# Patient Record
Sex: Female | Born: 1995 | Race: White | Hispanic: No | Marital: Single | Smoking: Former smoker
Health system: Southern US, Community
[De-identification: ages and names within clinical notes are randomized; demographics above are authoritative.]

## PROBLEM LIST (undated history)

## (undated) ENCOUNTER — Inpatient Hospital Stay (HOSPITAL_COMMUNITY): Payer: Self-pay

## (undated) DIAGNOSIS — Z789 Other specified health status: Secondary | ICD-10-CM

## (undated) HISTORY — PX: MOUTH SURGERY: SHX715

## (undated) HISTORY — PX: APPENDECTOMY: SHX54

## (undated) HISTORY — PX: WRIST SURGERY: SHX841

---

## 2016-03-15 ENCOUNTER — Other Ambulatory Visit: Payer: Self-pay | Admitting: Obstetrics and Gynecology

## 2016-03-15 ENCOUNTER — Ambulatory Visit (HOSPITAL_COMMUNITY)
Admission: RE | Admit: 2016-03-15 | Discharge: 2016-03-15 | Disposition: A | Discharge status: Home / Self Care | Payer: PRIVATE HEALTH INSURANCE | Source: Ambulatory Visit | Attending: Obstetrics and Gynecology | Admitting: Obstetrics and Gynecology

## 2016-03-15 DIAGNOSIS — O209 Hemorrhage in early pregnancy, unspecified: Secondary | ICD-10-CM

## 2016-03-15 DIAGNOSIS — Z3A01 Less than 8 weeks gestation of pregnancy: Secondary | ICD-10-CM | POA: Diagnosis not present

## 2016-03-15 DIAGNOSIS — N926 Irregular menstruation, unspecified: Secondary | ICD-10-CM

## 2016-03-15 DIAGNOSIS — Z36 Encounter for antenatal screening of mother: Secondary | ICD-10-CM | POA: Diagnosis not present

## 2016-03-15 DIAGNOSIS — Z3491 Encounter for supervision of normal pregnancy, unspecified, first trimester: Secondary | ICD-10-CM

## 2016-03-15 DIAGNOSIS — Z3201 Encounter for pregnancy test, result positive: Secondary | ICD-10-CM

## 2016-03-21 ENCOUNTER — Other Ambulatory Visit: Payer: Self-pay | Admitting: Obstetrics and Gynecology

## 2016-03-21 DIAGNOSIS — Z3201 Encounter for pregnancy test, result positive: Secondary | ICD-10-CM

## 2016-03-21 DIAGNOSIS — N926 Irregular menstruation, unspecified: Secondary | ICD-10-CM

## 2016-03-29 ENCOUNTER — Ambulatory Visit (HOSPITAL_COMMUNITY)
Admission: RE | Admit: 2016-03-29 | Discharge: 2016-03-29 | Disposition: A | Discharge status: Home / Self Care | Payer: PRIVATE HEALTH INSURANCE | Source: Ambulatory Visit | Attending: Obstetrics and Gynecology | Admitting: Obstetrics and Gynecology

## 2016-03-29 DIAGNOSIS — Z36 Encounter for antenatal screening of mother: Secondary | ICD-10-CM | POA: Diagnosis not present

## 2016-03-29 DIAGNOSIS — Z3A01 Less than 8 weeks gestation of pregnancy: Secondary | ICD-10-CM | POA: Diagnosis not present

## 2016-03-29 DIAGNOSIS — Z3201 Encounter for pregnancy test, result positive: Secondary | ICD-10-CM

## 2016-03-29 DIAGNOSIS — N926 Irregular menstruation, unspecified: Secondary | ICD-10-CM

## 2016-04-10 ENCOUNTER — Other Ambulatory Visit: Payer: Self-pay | Admitting: Obstetrics and Gynecology

## 2016-04-10 DIAGNOSIS — Z8742 Personal history of other diseases of the female genital tract: Secondary | ICD-10-CM

## 2016-04-12 ENCOUNTER — Ambulatory Visit (HOSPITAL_COMMUNITY)
Admission: RE | Admit: 2016-04-12 | Discharge: 2016-04-12 | Disposition: A | Discharge status: Home / Self Care | Payer: PRIVATE HEALTH INSURANCE | Source: Ambulatory Visit | Attending: Obstetrics and Gynecology | Admitting: Obstetrics and Gynecology

## 2016-04-12 DIAGNOSIS — O209 Hemorrhage in early pregnancy, unspecified: Secondary | ICD-10-CM | POA: Diagnosis not present

## 2016-04-12 DIAGNOSIS — Z3A09 9 weeks gestation of pregnancy: Secondary | ICD-10-CM | POA: Diagnosis not present

## 2016-04-12 DIAGNOSIS — Z8742 Personal history of other diseases of the female genital tract: Secondary | ICD-10-CM

## 2016-05-03 ENCOUNTER — Encounter: Payer: Self-pay | Admitting: Obstetrics & Gynecology

## 2016-05-03 ENCOUNTER — Ambulatory Visit (INDEPENDENT_AMBULATORY_CARE_PROVIDER_SITE_OTHER): Payer: PRIVATE HEALTH INSURANCE | Admitting: Obstetrics & Gynecology

## 2016-05-03 ENCOUNTER — Other Ambulatory Visit: Payer: Self-pay | Admitting: Obstetrics & Gynecology

## 2016-05-03 VITALS — BP 105/65 | HR 85 | Ht 66.0 in | Wt 131.0 lb

## 2016-05-03 DIAGNOSIS — Z36 Encounter for antenatal screening of mother: Secondary | ICD-10-CM

## 2016-05-03 DIAGNOSIS — O99330 Smoking (tobacco) complicating pregnancy, unspecified trimester: Secondary | ICD-10-CM | POA: Insufficient documentation

## 2016-05-03 DIAGNOSIS — Z3490 Encounter for supervision of normal pregnancy, unspecified, unspecified trimester: Secondary | ICD-10-CM | POA: Insufficient documentation

## 2016-05-03 DIAGNOSIS — O98311 Other infections with a predominantly sexual mode of transmission complicating pregnancy, first trimester: Secondary | ICD-10-CM

## 2016-05-03 DIAGNOSIS — A749 Chlamydial infection, unspecified: Secondary | ICD-10-CM

## 2016-05-03 DIAGNOSIS — O99331 Smoking (tobacco) complicating pregnancy, first trimester: Secondary | ICD-10-CM

## 2016-05-03 DIAGNOSIS — Z3481 Encounter for supervision of other normal pregnancy, first trimester: Secondary | ICD-10-CM

## 2016-05-03 DIAGNOSIS — Z113 Encounter for screening for infections with a predominantly sexual mode of transmission: Secondary | ICD-10-CM

## 2016-05-03 DIAGNOSIS — O98819 Other maternal infectious and parasitic diseases complicating pregnancy, unspecified trimester: Secondary | ICD-10-CM

## 2016-05-03 DIAGNOSIS — O98811 Other maternal infectious and parasitic diseases complicating pregnancy, first trimester: Secondary | ICD-10-CM

## 2016-05-03 MED ORDER — PROMETHAZINE HCL 25 MG PO TABS: 25.0000 mg | ORAL_TABLET | Freq: Four times a day (QID) | ORAL | Status: DC | PRN

## 2016-05-03 NOTE — Progress Notes (Signed)
Subjective:    Vicki Hooper is being seen today for her first obstetrical visit.  This is not a planned pregnancy. She is at 6243w1d gestation. Her obstetrical history is significant for smoker- stopped after learning of pregnancy. Relationship with FOB: significant other, not living together. Patient does intend to breast feed. Pregnancy history fully reviewed.  Pt reports that she was told that she had a cervical infection at her visit with Vicki Hooper.  She was treated.  Menstrual History: OB History    Gravida Para Term Preterm AB TAB SAB Ectopic Multiple Living   1                No LMP recorded. Patient is pregnant.    The following portions of the patient's history were reviewed and updated as appropriate: allergies, current medications, past family history, past medical history, past social history, past surgical history and problem list.  Review of Systems Pertinent items are noted in HPI.    Objective:  BP 105/65 mmHg  Pulse 85  Ht 5\' 6"  (1.676 m)  Wt 131 lb (59.421 kg)  BMI 21.15 kg/m2 General Appearance:    Alert, cooperative, no distress, appears stated age  Head:    Normocephalic, without obvious abnormality, atraumatic  Eyes:    conjunctiva/corneas clear, EOM's intact, both eyes  Ears:    Normal external ear canals, both ears  Nose:   Nares normal, septum midline, mucosa normal, no drainage    or sinus tenderness  Throat:   Lips, mucosa, and tongue normal; teeth and gums normal  Neck:   Supple, symmetrical, trachea midline, no adenopathy;    thyroid:  no enlargement/tenderness/nodules  Back:     Symmetric, no curvature, ROM normal, no CVA tenderness  Lungs:     Clear to auscultation bilaterally, respirations unlabored  Chest Wall:    No tenderness or deformity   Heart:    Regular rate and rhythm, S1 and S2 normal, no murmur, rub   or gallop  Breast Exam:    No tenderness or nipple abnormality. Pt has lots of lumps in breasts- stable per pt  Abdomen:      Soft, non-tender, bowel sounds active all four quadrants,    no masses, no organomegaly  Genitalia:  Deferred (per pt PAP and cx done by Vicki Hooper)     Extremities:   Extremities normal, atraumatic, no cyanosis or edema  Pulses:   2+ and symmetric all extremities  Skin:   Skin color, texture, turgor normal, no rashes or lesions      Assessment:    Pregnancy at 12 and 1/7 weeks   Fibrocystic breasts    Plan:    Initial labs drawn. Prenatal vitamins. Problem list reviewed and updated. AFP3 discussed: requested. Role of ultrasound in pregnancy discussed; fetal survey: requested. Amniocentesis discussed: not indicated. Follow up in 4 weeks. 70% of 40 min visit spent on counseling and coordination of care.   Phenergan 25 mg po q 6 hours prn.  Initial rx by Dr. Chilton Hooper Need release of records from Vicki Hooper to get Pap and cx  Vicki Hooper, M.D., Evern CoreFACOG

## 2016-05-03 NOTE — Patient Instructions (Signed)
First Trimester of Pregnancy The first trimester of pregnancy is from week 1 until the end of week 12 (months 1 through 3). A week after a sperm fertilizes an egg, the egg will implant on the wall of the uterus. This embryo will begin to develop into a baby. Genes from you and your partner are forming the baby. The female genes determine whether the baby is a boy or a girl. At 6-8 weeks, the eyes and face are formed, and the heartbeat can be seen on ultrasound. At the end of 12 weeks, all the baby's organs are formed.  Now that you are pregnant, you will want to do everything you can to have a healthy baby. Two of the most important things are to get good prenatal care and to follow your health care provider's instructions. Prenatal care is all the medical care you receive before the baby's birth. This care will help prevent, find, and treat any problems during the pregnancy and childbirth. BODY CHANGES Your body goes through many changes during pregnancy. The changes vary from woman to woman.   You may gain or lose a couple of pounds at first.  You may feel sick to your stomach (nauseous) and throw up (vomit). If the vomiting is uncontrollable, call your health care provider.  You may tire easily.  You may develop headaches that can be relieved by medicines approved by your health care provider.  You may urinate more often. Painful urination may mean you have a bladder infection.  You may develop heartburn as a result of your pregnancy.  You may develop constipation because certain hormones are causing the muscles that push waste through your intestines to slow down.  You may develop hemorrhoids or swollen, bulging veins (varicose veins).  Your breasts may begin to grow larger and become tender. Your nipples may stick out more, and the tissue that surrounds them (areola) may become darker.  Your gums may bleed and may be sensitive to brushing and flossing.  Dark spots or blotches (chloasma,  mask of pregnancy) may develop on your face. This will likely fade after the baby is born.  Your menstrual periods will stop.  You may have a loss of appetite.  You may develop cravings for certain kinds of food.  You may have changes in your emotions from day to day, such as being excited to be pregnant or being concerned that something may go wrong with the pregnancy and baby.  You may have more vivid and strange dreams.  You may have changes in your hair. These can include thickening of your hair, rapid growth, and changes in texture. Some women also have hair loss during or after pregnancy, or hair that feels dry or thin. Your hair will most likely return to normal after your baby is born. WHAT TO EXPECT AT YOUR PRENATAL VISITS During a routine prenatal visit:  You will be weighed to make sure you and the baby are growing normally.  Your blood pressure will be taken.  Your abdomen will be measured to track your baby's growth.  The fetal heartbeat will be listened to starting around week 10 or 12 of your pregnancy.  Test results from any previous visits will be discussed. Your health care provider may ask you:  How you are feeling.  If you are feeling the baby move.  If you have had any abnormal symptoms, such as leaking fluid, bleeding, severe headaches, or abdominal cramping.  If you are using any tobacco products,   including cigarettes, chewing tobacco, and electronic cigarettes.  If you have any questions. Other tests that may be performed during your first trimester include:  Blood tests to find your blood type and to check for the presence of any previous infections. They will also be used to check for low iron levels (anemia) and Rh antibodies. Later in the pregnancy, blood tests for diabetes will be done along with other tests if problems develop.  Urine tests to check for infections, diabetes, or protein in the urine.  An ultrasound to confirm the proper growth  and development of the baby.  An amniocentesis to check for possible genetic problems.  Fetal screens for spina bifida and Down syndrome.  You may need other tests to make sure you and the baby are doing well.  HIV (human immunodeficiency virus) testing. Routine prenatal testing includes screening for HIV, unless you choose not to have this test. HOME CARE INSTRUCTIONS  Medicines  Follow your health care provider's instructions regarding medicine use. Specific medicines may be either safe or unsafe to take during pregnancy.  Take your prenatal vitamins as directed.  If you develop constipation, try taking a stool softener if your health care provider approves. Diet  Eat regular, well-balanced meals. Choose a variety of foods, such as meat or vegetable-based protein, fish, milk and low-fat dairy products, vegetables, fruits, and whole grain breads and cereals. Your health care provider will help you determine the amount of weight gain that is right for you.  Avoid raw meat and uncooked cheese. These carry germs that can cause birth defects in the baby.  Eating four or five small meals rather than three large meals a day may help relieve nausea and vomiting. If you start to feel nauseous, eating a few soda crackers can be helpful. Drinking liquids between meals instead of during meals also seems to help nausea and vomiting.  If you develop constipation, eat more high-fiber foods, such as fresh vegetables or fruit and whole grains. Drink enough fluids to keep your urine clear or pale yellow. Activity and Exercise  Exercise only as directed by your health care provider. Exercising will help you:  Control your weight.  Stay in shape.  Be prepared for labor and delivery.  Experiencing pain or cramping in the lower abdomen or low back is a good sign that you should stop exercising. Check with your health care provider before continuing normal exercises.  Try to avoid standing for long  periods of time. Move your legs often if you must stand in one place for a long time.  Avoid heavy lifting.  Wear low-heeled shoes, and practice good posture.  You may continue to have sex unless your health care provider directs you otherwise. Relief of Pain or Discomfort  Wear a good support bra for breast tenderness.   Take warm sitz baths to soothe any pain or discomfort caused by hemorrhoids. Use hemorrhoid cream if your health care provider approves.   Rest with your legs elevated if you have leg cramps or low back pain.  If you develop varicose veins in your legs, wear support hose. Elevate your feet for 15 minutes, 3-4 times a day. Limit salt in your diet. Prenatal Care  Schedule your prenatal visits by the twelfth week of pregnancy. They are usually scheduled monthly at first, then more often in the last 2 months before delivery.  Write down your questions. Take them to your prenatal visits.  Keep all your prenatal visits as directed by your   health care provider. Safety  Wear your seat belt at all times when driving.  Make a list of emergency phone numbers, including numbers for family, friends, the hospital, and police and fire departments. General Tips  Ask your health care provider for a referral to a local prenatal education class. Begin classes no later than at the beginning of month 6 of your pregnancy.  Ask for help if you have counseling or nutritional needs during pregnancy. Your health care provider can offer advice or refer you to specialists for help with various needs.  Do not use hot tubs, steam rooms, or saunas.  Do not douche or use tampons or scented sanitary pads.  Do not cross your legs for long periods of time.  Avoid cat litter boxes and soil used by cats. These carry germs that can cause birth defects in the baby and possibly loss of the fetus by miscarriage or stillbirth.  Avoid all smoking, herbs, alcohol, and medicines not prescribed by  your health care provider. Chemicals in these affect the formation and growth of the baby.  Do not use any tobacco products, including cigarettes, chewing tobacco, and electronic cigarettes. If you need help quitting, ask your health care provider. You may receive counseling support and other resources to help you quit.  Schedule a dentist appointment. At home, brush your teeth with a soft toothbrush and be gentle when you floss. SEEK MEDICAL CARE IF:   You have dizziness.  You have mild pelvic cramps, pelvic pressure, or nagging pain in the abdominal area.  You have persistent nausea, vomiting, or diarrhea.  You have a bad smelling vaginal discharge.  You have pain with urination.  You notice increased swelling in your face, hands, legs, or ankles. SEEK IMMEDIATE MEDICAL CARE IF:   You have a fever.  You are leaking fluid from your vagina.  You have spotting or bleeding from your vagina.  You have severe abdominal cramping or pain.  You have rapid weight gain or loss.  You vomit blood or material that looks like coffee grounds.  You are exposed to German measles and have never had them.  You are exposed to fifth disease or chickenpox.  You develop a severe headache.  You have shortness of breath.  You have any kind of trauma, such as from a fall or a car accident.   This information is not intended to replace advice given to you by your health care provider. Make sure you discuss any questions you have with your health care provider.   Document Released: 11/13/2001 Document Revised: 12/10/2014 Document Reviewed: 09/29/2013 Elsevier Interactive Patient Education 2016 Elsevier Inc.  

## 2016-05-04 LAB — OBSTETRIC PANEL
Antibody Screen: NEGATIVE
BASOS PCT: 0 %
Basophils Absolute: 0 cells/uL (ref 0–200)
EOS ABS: 156 {cells}/uL (ref 15–500)
EOS PCT: 2 %
HEMATOCRIT: 38.4 % (ref 35.0–45.0)
HEMOGLOBIN: 12.7 g/dL (ref 11.7–15.5)
Hepatitis B Surface Ag: NEGATIVE
LYMPHS ABS: 1950 {cells}/uL (ref 850–3900)
Lymphocytes Relative: 25 %
MCH: 30.2 pg (ref 27.0–33.0)
MCHC: 33.1 g/dL (ref 32.0–36.0)
MCV: 91.2 fL (ref 80.0–100.0)
MONOS PCT: 7 %
MPV: 9.9 fL (ref 7.5–12.5)
Monocytes Absolute: 546 cells/uL (ref 200–950)
NEUTROS ABS: 5148 {cells}/uL (ref 1500–7800)
Neutrophils Relative %: 66 %
Platelets: 210 10*3/uL (ref 140–400)
RBC: 4.21 MIL/uL (ref 3.80–5.10)
RDW: 12.8 % (ref 11.0–15.0)
Rh Type: POSITIVE
Rubella: 3.72 Index — ABNORMAL HIGH (ref <0.90)
WBC: 7.8 10*3/uL (ref 3.8–10.8)

## 2016-05-04 LAB — GC/CHLAMYDIA PROBE AMP
CT Probe RNA: DETECTED — AB
GC Probe RNA: NOT DETECTED

## 2016-05-04 LAB — HIV ANTIBODY (ROUTINE TESTING W REFLEX): HIV 1&2 Ab, 4th Generation: NONREACTIVE

## 2016-05-06 LAB — CULTURE, URINE COMPREHENSIVE

## 2016-05-07 ENCOUNTER — Other Ambulatory Visit: Payer: Self-pay | Admitting: Obstetrics & Gynecology

## 2016-05-07 ENCOUNTER — Telehealth: Payer: Self-pay

## 2016-05-07 DIAGNOSIS — A749 Chlamydial infection, unspecified: Secondary | ICD-10-CM

## 2016-05-07 DIAGNOSIS — O98819 Other maternal infectious and parasitic diseases complicating pregnancy, unspecified trimester: Principal | ICD-10-CM

## 2016-05-07 DIAGNOSIS — O98811 Other maternal infectious and parasitic diseases complicating pregnancy, first trimester: Secondary | ICD-10-CM

## 2016-05-07 MED ORDER — AZITHROMYCIN 500 MG PO TABS: 1000.0000 mg | ORAL_TABLET | Freq: Once | ORAL | Status: DC

## 2016-05-07 NOTE — Telephone Encounter (Signed)
Please call pt. She had chlamydia. She AND her partner need to be treated. Please ask her not to be sexually active until her TOC is negative.   Thx  clh-S      Attempted to reach patient and unable to leave message. Armandina StammerJennifer Kenyata Napier RN BSN

## 2016-05-08 NOTE — Telephone Encounter (Signed)
Phone called again and now states that phone number has been changed or disconnected. Armandina StammerJennifer Corky Blumstein RN BSN

## 2016-05-20 ENCOUNTER — Encounter (HOSPITAL_COMMUNITY): Payer: Self-pay

## 2016-05-20 ENCOUNTER — Inpatient Hospital Stay (HOSPITAL_COMMUNITY)
Admission: AD | Admit: 2016-05-20 | Discharge: 2016-05-20 | Disposition: A | Discharge status: Home / Self Care | Payer: PRIVATE HEALTH INSURANCE | Source: Ambulatory Visit | Attending: Obstetrics and Gynecology | Admitting: Obstetrics and Gynecology

## 2016-05-20 ENCOUNTER — Inpatient Hospital Stay (HOSPITAL_COMMUNITY): Payer: PRIVATE HEALTH INSURANCE

## 2016-05-20 DIAGNOSIS — O468X2 Other antepartum hemorrhage, second trimester: Secondary | ICD-10-CM | POA: Diagnosis not present

## 2016-05-20 DIAGNOSIS — O99331 Smoking (tobacco) complicating pregnancy, first trimester: Secondary | ICD-10-CM

## 2016-05-20 DIAGNOSIS — O98811 Other maternal infectious and parasitic diseases complicating pregnancy, first trimester: Secondary | ICD-10-CM

## 2016-05-20 DIAGNOSIS — Z3481 Encounter for supervision of other normal pregnancy, first trimester: Secondary | ICD-10-CM

## 2016-05-20 DIAGNOSIS — O209 Hemorrhage in early pregnancy, unspecified: Secondary | ICD-10-CM | POA: Insufficient documentation

## 2016-05-20 DIAGNOSIS — O99332 Smoking (tobacco) complicating pregnancy, second trimester: Secondary | ICD-10-CM | POA: Insufficient documentation

## 2016-05-20 DIAGNOSIS — O4692 Antepartum hemorrhage, unspecified, second trimester: Secondary | ICD-10-CM

## 2016-05-20 DIAGNOSIS — A749 Chlamydial infection, unspecified: Secondary | ICD-10-CM

## 2016-05-20 DIAGNOSIS — F17210 Nicotine dependence, cigarettes, uncomplicated: Secondary | ICD-10-CM | POA: Insufficient documentation

## 2016-05-20 DIAGNOSIS — Z3A14 14 weeks gestation of pregnancy: Secondary | ICD-10-CM | POA: Insufficient documentation

## 2016-05-20 DIAGNOSIS — O418X20 Other specified disorders of amniotic fluid and membranes, second trimester, not applicable or unspecified: Secondary | ICD-10-CM

## 2016-05-20 LAB — URINALYSIS, ROUTINE W REFLEX MICROSCOPIC
Bilirubin Urine: NEGATIVE
Glucose, UA: NEGATIVE mg/dL
Ketones, ur: NEGATIVE mg/dL
LEUKOCYTES UA: NEGATIVE
Nitrite: NEGATIVE
PROTEIN: NEGATIVE mg/dL
Specific Gravity, Urine: 1.01 (ref 1.005–1.030)
pH: 7 (ref 5.0–8.0)

## 2016-05-20 LAB — URINE MICROSCOPIC-ADD ON: WBC UA: NONE SEEN WBC/hpf (ref 0–5)

## 2016-05-20 NOTE — MAU Note (Signed)
Pt states she began having bleeding & cramping yesterday, continues today, states bleeding is heavy & she is passing clots.

## 2016-05-20 NOTE — MAU Provider Note (Signed)
History     CSN: [id number]  Arrival date and time: 05/20/16 1457   None     Chief Complaint  Patient presents with  . Vaginal Bleeding  . Abdominal Pain   HPI  Pt is 4022w4d pregnant G1P0 who is a patient of Dr[facility] Erin [facility]raway [facility]. Pt has had bleeding in pregnancy and [facility]  Pt last had [facility] 1 month ago [facility] pt's bleeding stopped then she had increase in bleeding and cramping that began yesterday. Pt has not taken anything for the cramping.   Pt states her bleeding has lessened since she has been in MAU. Rn note:  Expand All Collapse All   Pt states she began having bleeding & cramping yesterday, continues today, states bleeding is heavy & she is passing clots.         History reviewed. No pertinent past medical history.  Past Surgical History  Procedure Laterality Date  . Mouth surgery    . Wrist surgery Left     Family History  Problem Relation Age of Onset  . Diabetes Maternal Grandfather   . Diabetes Paternal Grandfather   . Diabetes Sister   . Hypertension Maternal Grandfather   . Hypertension Mother     Social History  Substance Use Topics  . Smoking status: Current Some Day Smoker -- 0.25 packs/day for 3 years    Types: Cigarettes    Last Attempt to Quit: 02/01/2016  . Smokeless tobacco: Never Used  . Alcohol Use: No    Allergies: No Known Allergies  Prescriptions prior to admission  Medication Sig Dispense Refill Last Dose  . acetaminophen (TYLENOL) 500 MG tablet Take 1,000 mg by mouth every 6 (six) hours as needed for mild pain or headache.   05/19/2016 at 1800  . Prenatal Vit-Fe Fumarate-FA (PRENATAL MULTIVITAMIN) TABS tablet Take 1 tablet by mouth every evening.   05/19/2016 at Unknown time  . promethazine (PHENERGAN) 25 MG tablet Take 1 tablet (25 mg total) by mouth every 6 (six) hours as needed for nausea or vomiting. 30 tablet 2 Past Week at Unknown time  . azithromycin (ZITHROMAX) 500 MG tablet Take 2 tablets  (1,000 mg total) by mouth once. (Patient not taking: Reported on 05/20/2016) 2 tablet 1     Review of Systems  Constitutional: Negative for fever and chills.  Gastrointestinal: Positive for abdominal pain. Negative for nausea, vomiting, diarrhea and constipation.  Genitourinary: Negative for dysuria.   Physical Exam   Blood pressure 110/61, pulse 79, temperature 98.3 F (36.8 C), temperature source Oral, resp. rate 18, height 5\' 6"  (1.676 m), weight 135 lb (61.236 kg).  Physical Exam  Nursing note and vitals reviewed. Constitutional: She is oriented to person, place, and time. She appears well-developed and well-nourished. No distress.  HENT:  Head: Normocephalic.  Eyes: Pupils are equal, round, and reactive to light.  Neck: Normal range of motion. Neck supple.  Cardiovascular: Normal rate.   Respiratory: Effort normal.  GI: Soft. She exhibits no distension. There is no tenderness.  Genitourinary:  Small amount of bright red blood in vault; cervix closed; uterus gravid AGA, NT  Musculoskeletal: Normal range of motion.  Neurological: She is alert and oriented to person, place, and time.  Skin: Skin is warm and dry.  Psychiatric: She has a normal mood and affect.    MAU Course  Procedures FHT 155 with doppler Results for orders placed or performed during the hospital encounter of 05/20/16 (from the past 24  hour(s))  Urinalysis, Routine w reflex microscopic (not at [facility])     Status: Abnormal   Collection Time: 05/20/16  3:32 PM  Result Value Ref Range   Color, Urine YELLOW YELLOW   APPearance CLEAR CLEAR   Specific Gravity, Urine 1.010 1.005 - 1.030   pH 7.0 5.0 - 8.0   Glucose, UA NEGATIVE NEGATIVE mg/dL   Hgb urine dipstick LARGE (A) NEGATIVE   Bilirubin Urine NEGATIVE NEGATIVE   Ketones, ur NEGATIVE NEGATIVE mg/dL   Protein, ur NEGATIVE NEGATIVE mg/dL   Nitrite NEGATIVE NEGATIVE   Leukocytes, UA NEGATIVE NEGATIVE  Urine microscopic-add on     Status: Abnormal    Collection Time: 05/20/16  3:32 PM  Result Value Ref Range   Squamous Epithelial / LPF 0-5 (A) NONE SEEN   WBC, UA NONE SEEN 0 - 5 WBC/hpf   RBC / HPF 0-5 0 - 5 RBC/hpf   Bacteria, UA RARE (A) NONE SEEN   Urine-Other MUCOUS PRESENT   [place] Mfm Ob Limited  05/20/2016  OBSTETRICAL ULTRASOUND: This exam was performed within a [place] [facility]. The OB US report was generated in the AS system, and faxed to the ordering physician.  This report is available in the [employer]. See the AS Obstetric US report via the Image Link. US showed viable IUP [redacted]w[redacted]d with fundal SCH  Assessment and Plan  Bleeding in pregnancy- second trimester Viable IUP [redacted]w[redacted]d Fundal [facility] Pelvic rest- f/u with Dr. Erin Fulling as scheduled Return if increase in bleeding  Vicki Hooper 05/20/2016, 4:35 PM

## 2016-05-24 DIAGNOSIS — A749 Chlamydial infection, unspecified: Secondary | ICD-10-CM

## 2016-05-24 DIAGNOSIS — O98812 Other maternal infectious and parasitic diseases complicating pregnancy, second trimester: Principal | ICD-10-CM

## 2016-05-31 ENCOUNTER — Encounter: Payer: Self-pay | Admitting: Obstetrics & Gynecology

## 2016-05-31 ENCOUNTER — Ambulatory Visit (INDEPENDENT_AMBULATORY_CARE_PROVIDER_SITE_OTHER): Payer: PRIVATE HEALTH INSURANCE | Admitting: Obstetrics & Gynecology

## 2016-05-31 VITALS — BP 136/84 | HR 96 | Wt 134.0 lb

## 2016-05-31 DIAGNOSIS — O039 Complete or unspecified spontaneous abortion without complication: Secondary | ICD-10-CM | POA: Diagnosis not present

## 2016-05-31 MED ORDER — AZITHROMYCIN 1 G PO PACK: 1.0000 g | PACK | Freq: Once | ORAL | Status: DC

## 2016-05-31 NOTE — Progress Notes (Signed)
Pt seen in MAU 1 week ago for sub chorionic hemorrhage  She continues to bleed

## 2016-05-31 NOTE — Patient Instructions (Signed)
Miscarriage  A miscarriage is the sudden loss of an unborn baby (fetus) before the 20th week of pregnancy. Most miscarriages happen in the first 3 months of pregnancy. Sometimes, it happens before a woman even knows she is pregnant. A miscarriage is also called a "spontaneous miscarriage" or "early pregnancy loss." Having a miscarriage can be an emotional experience. Talk with your caregiver about any questions you may have about miscarrying, the grieving process, and your future pregnancy plans.  CAUSES    Problems with the fetal chromosomes that make it impossible for the baby to develop normally. Problems with the baby's genes or chromosomes are most often the result of errors that occur, by chance, as the embryo divides and grows. The problems are not inherited from the parents.   Infection of the cervix or uterus.    Hormone problems.    Problems with the cervix, such as having an incompetent cervix. This is when the tissue in the cervix is not strong enough to hold the pregnancy.    Problems with the uterus, such as an abnormally shaped uterus, uterine fibroids, or congenital abnormalities.    Certain medical conditions.    Smoking, drinking alcohol, or taking illegal drugs.    Trauma.   Often, the cause of a miscarriage is unknown.   SYMPTOMS    Vaginal bleeding or spotting, with or without cramps or pain.   Pain or cramping in the abdomen or lower back.   Passing fluid, tissue, or blood clots from the vagina.  DIAGNOSIS   Your caregiver will perform a physical exam. You may also have an ultrasound to confirm the miscarriage. Blood or urine tests may also be ordered.  TREATMENT    Sometimes, treatment is not necessary if you naturally pass all the fetal tissue that was in the uterus. If some of the fetus or placenta remains in the body (incomplete miscarriage), tissue left behind may become infected and must be removed. Usually, a dilation and curettage (D and C) procedure is performed.  During a D and C procedure, the cervix is widened (dilated) and any remaining fetal or placental tissue is gently removed from the uterus.   Antibiotic medicines are prescribed if there is an infection. Other medicines may be given to reduce the size of the uterus (contract) if there is a lot of bleeding.   If you have Rh negative blood and your baby was Rh positive, you will need a Rh immunoglobulin shot. This shot will protect any future baby from having Rh blood problems in future pregnancies.  HOME CARE INSTRUCTIONS    Your caregiver may order bed rest or may allow you to continue light activity. Resume activity as directed by your caregiver.   Have someone help with home and family responsibilities during this time.    Keep track of the number of sanitary pads you use each day and how soaked (saturated) they are. Write down this information.    Do not use tampons. Do not douche or have sexual intercourse until approved by your caregiver.    Only take over-the-counter or prescription medicines for pain or discomfort as directed by your caregiver.    Do not take aspirin. Aspirin can cause bleeding.    Keep all follow-up appointments with your caregiver.    If you or your partner have problems with grieving, talk to your caregiver or seek counseling to help cope with the pregnancy loss. Allow enough time to grieve before trying to get pregnant again.     SEEK IMMEDIATE MEDICAL CARE IF:    You have severe cramps or pain in your back or abdomen.   You have a fever.   You pass large blood clots (walnut-sized or larger) ortissue from your vagina. Save any tissue for your caregiver to inspect.    Your bleeding increases.    You have a thick, bad-smelling vaginal discharge.   You become lightheaded, weak, or you faint.    You have chills.   MAKE SURE YOU:   Understand these instructions.   Will watch your condition.   Will get help right away if you are not doing well or get worse.     This  information is not intended to replace advice given to you by your health care provider. Make sure you discuss any questions you have with your health care provider.     Document Released: 05/15/2001 Document Revised: 03/16/2013 Document Reviewed: 01/08/2012  Elsevier Interactive Patient Education 2016 Elsevier Inc.

## 2016-05-31 NOTE — Progress Notes (Signed)
History:  20 y.o. G1P0 here today for f/u bleedig in pregnancy. Pt reports passage of clots and tissue over the last week.  Now she reports only light spotting with no pain.  She reports that pain was severe initially.  She never picked up her Rx for chlamydia.    The following portions of the patient's history were reviewed and updated as appropriate: allergies, current medications, past family history, past medical history, past social history, past surgical history and problem list.   Objective:  Physical Exam Blood pressure 136/84, pulse 96, weight 134 lb (60.782 kg). Gen: NAD Abd: Soft, nontender and nondistended Pelvic: not done  Labs and Imaging [place] Mfm Ob Limited  05/20/2016  OBSTETRICAL ULTRASOUND: This exam was performed within a [place] [facility]. The OB US report was generated in the AS system, and faxed to the ordering physician.  This report is available in the [employer]. See the AS Obstetric US report via the Image Link.  Bedside sono- uterus empty.  Endometrial stripe 1.2 cm.  No POC noted      Assessment & Plan:  SAB- completed  Quant HCG F/u in 2-3 week for repeat cx and to check on pt post SAB  Tanaysha Alkins L. Harraway-Smith, M.D., [facility] CoreFACOG

## 2016-05-31 NOTE — Telephone Encounter (Signed)
Patient came for appointment on 05-31-16 and we discussed these results. Armandina StammerJennifer Yehudis Monceaux RN BSN

## 2016-06-01 LAB — HCG, QUANTITATIVE, PREGNANCY: HCG, BETA CHAIN, QUANT, S: 423.5 m[IU]/mL — AB

## 2016-06-04 ENCOUNTER — Telehealth: Payer: Self-pay | Admitting: General Practice

## 2016-06-04 ENCOUNTER — Encounter: Payer: Self-pay | Admitting: Obstetrics & Gynecology

## 2016-06-04 NOTE — Telephone Encounter (Signed)
Patient called office and left message stating she was seen 6/29 and was offered a doctor's note for work. Patient states she does actually need that note for her job because she ended up taking Friday night off work.

## 2016-06-11 ENCOUNTER — Encounter: Payer: Self-pay | Admitting: General Practice

## 2016-06-11 NOTE — Telephone Encounter (Signed)
Left message for patient to see if she received doctors note. Armandina StammerJennifer Howard RN BSN

## 2016-06-28 ENCOUNTER — Ambulatory Visit: Payer: PRIVATE HEALTH INSURANCE | Admitting: Family Medicine

## 2016-07-04 ENCOUNTER — Ambulatory Visit: Payer: PRIVATE HEALTH INSURANCE | Admitting: Family Medicine

## 2016-07-04 DIAGNOSIS — Z113 Encounter for screening for infections with a predominantly sexual mode of transmission: Secondary | ICD-10-CM

## 2017-05-20 IMAGING — US US OB COMP LESS 14 WK
1 series · 15 of 28 positions shown · non-contrast
Comparison: None.

CLINICAL DATA: Uncertain dates.

EXAM:
OBSTETRIC <14 WK US AND TRANSVAGINAL OB US
TECHNIQUE: Both transabdominal and transvaginal ultrasound examinations were
performed for complete evaluation of the gestation as well as the
maternal uterus, adnexal regions, and pelvic cul-de-sac.
Transvaginal technique was performed to assess early pregnancy.

[Series 1: us ob comp less 14 wk · 15 of 33 slices shown]
[im 1/33]
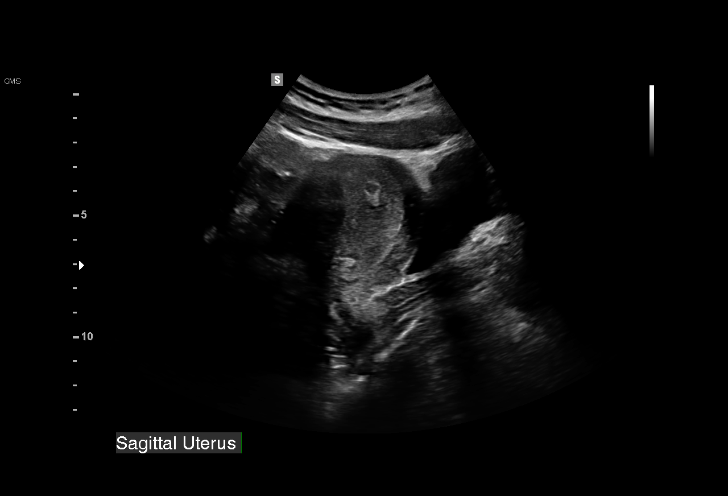
[im 3/33]
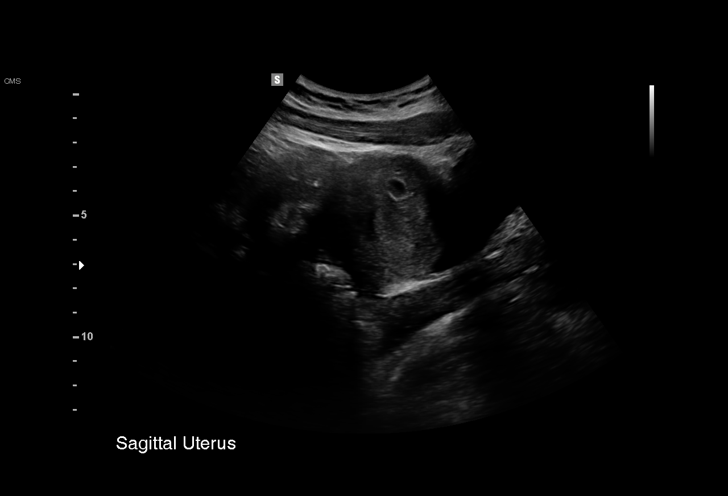
[im 5/33]
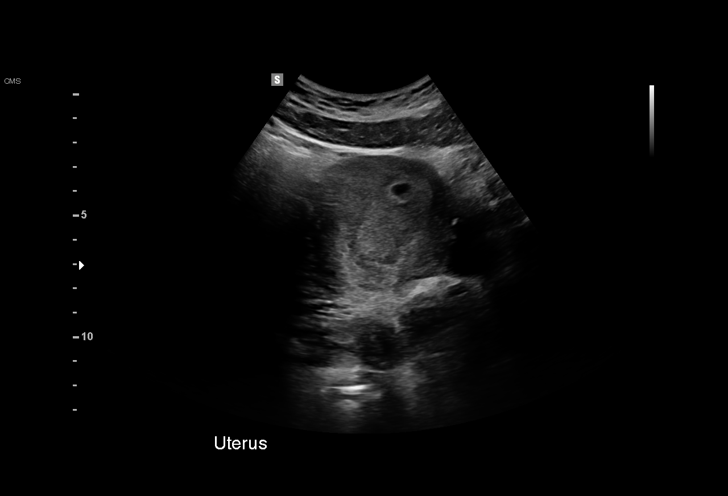
[im 8/33]
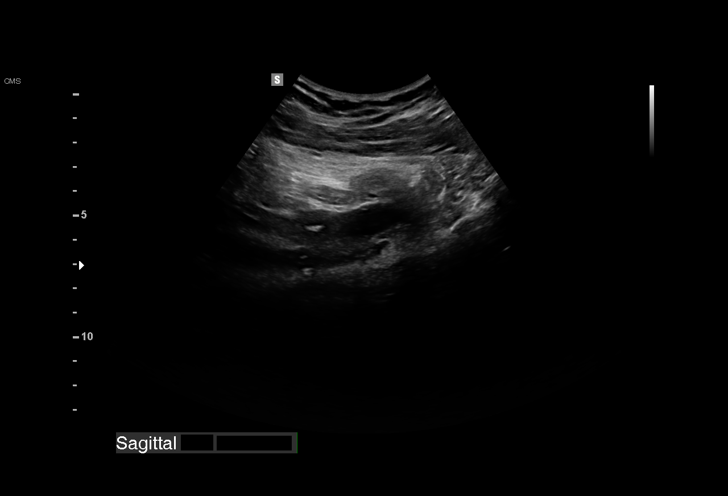
[im 10/33]
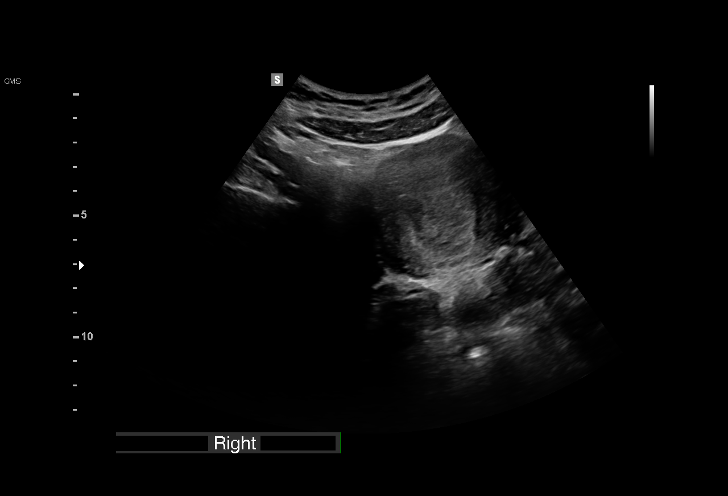
[im 12/33]
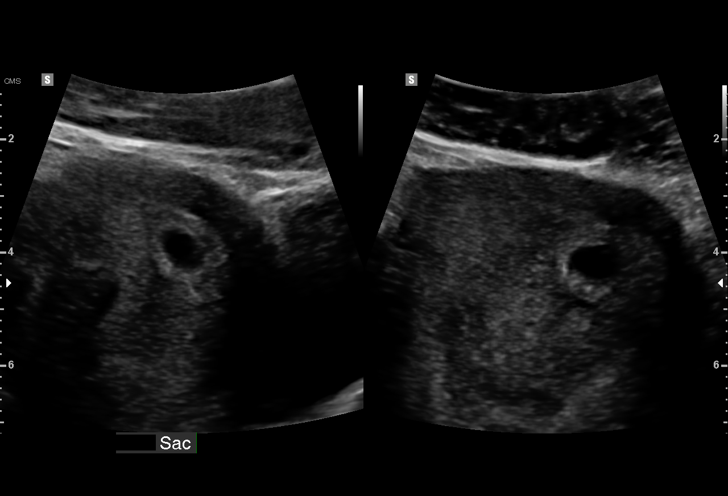
[im 15/33]
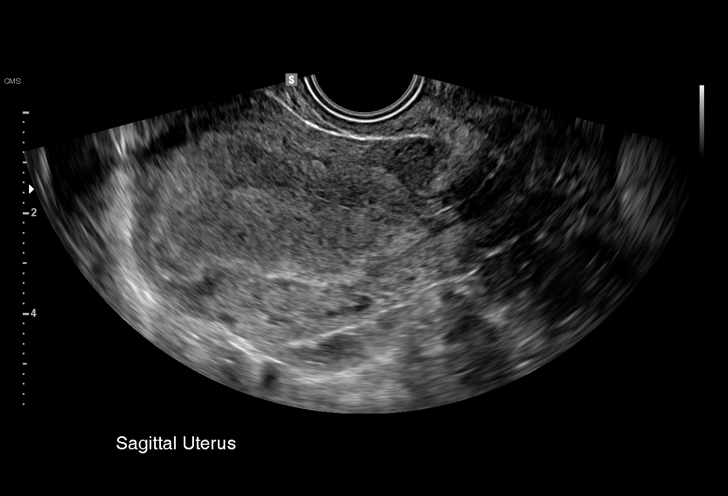
[im 17/33]
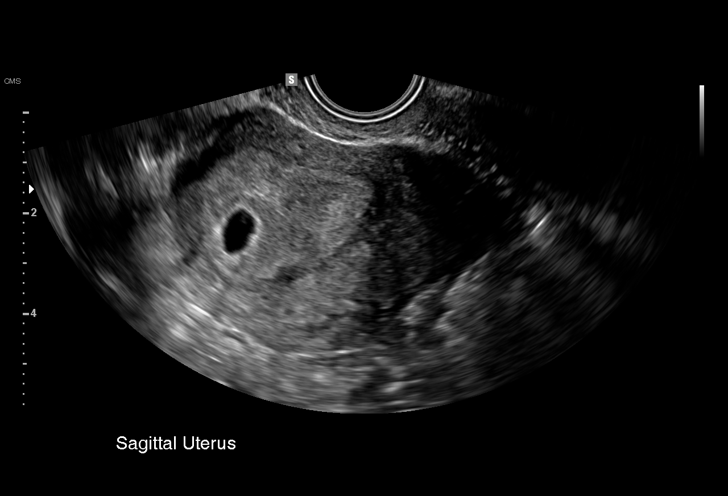
[im 18/33]
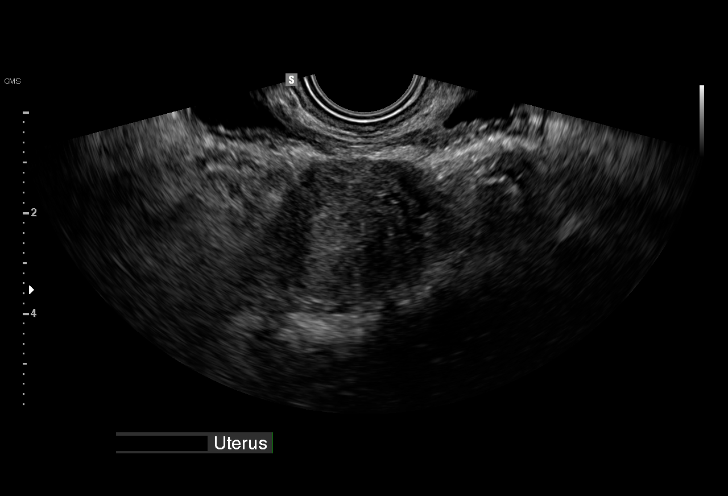
[im 21/33]
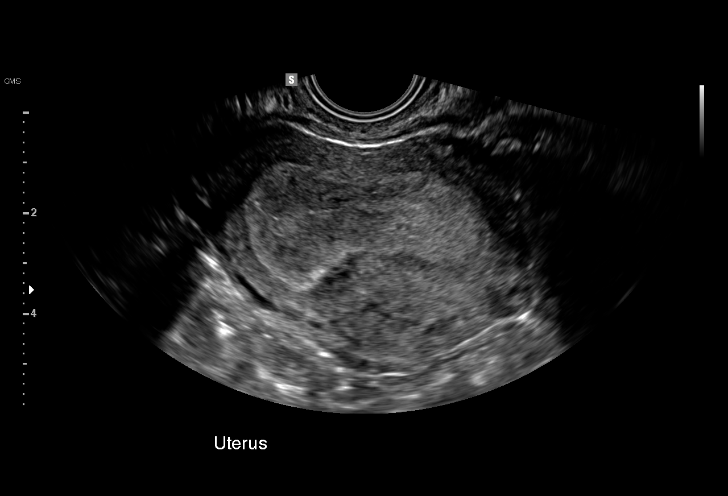
[im 23/33]
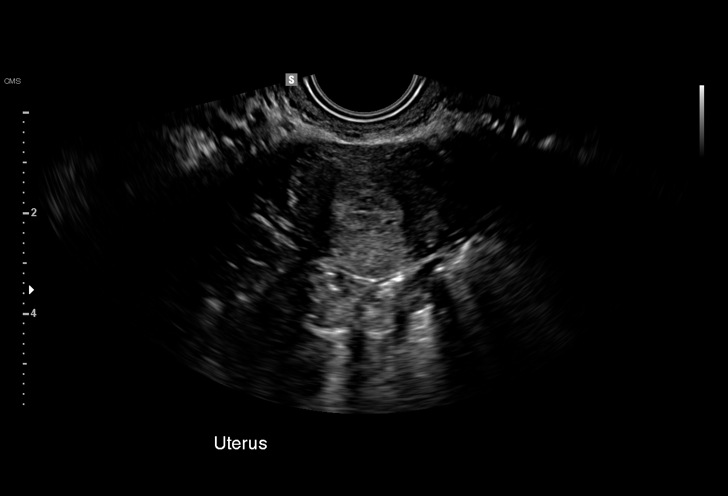
[im 25/33]
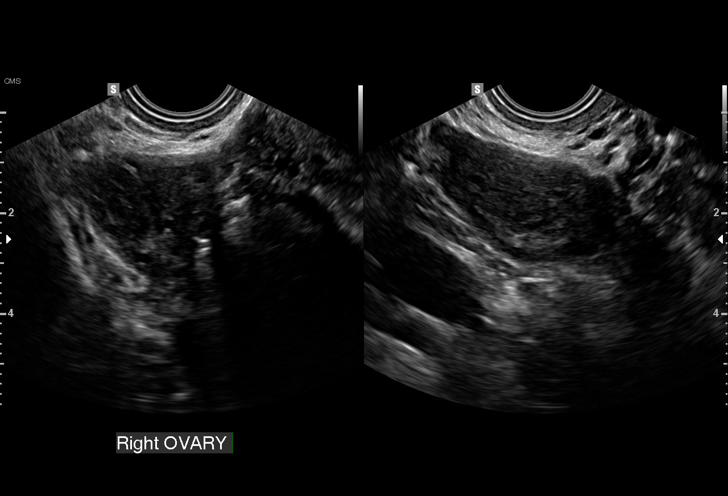
[im 28/33]
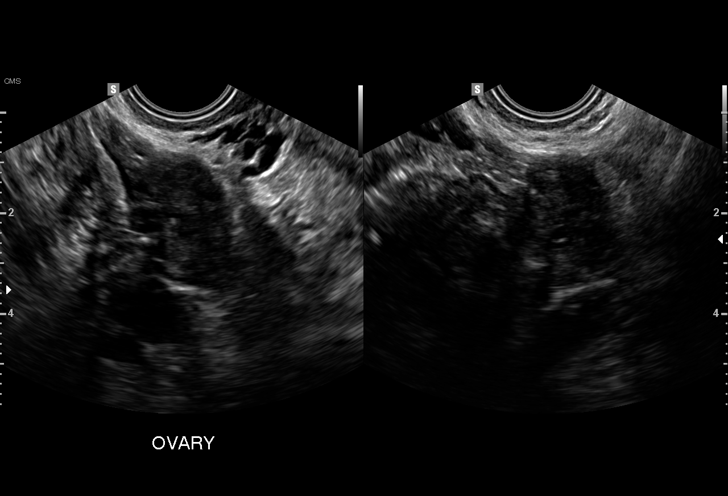
[im 30/33]
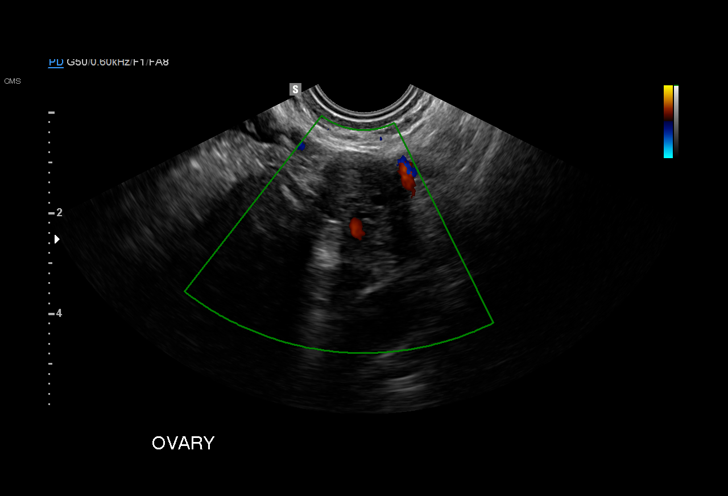
[im 33/33]
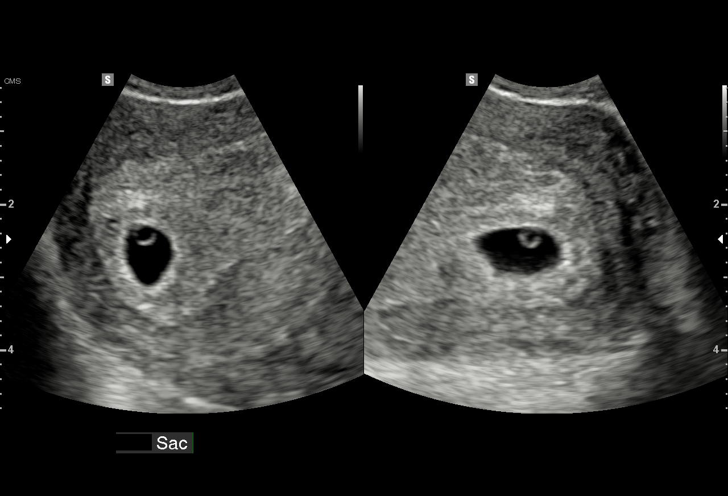

[15 of 28 positions shown; findings below may reference images not displayed]

FINDINGS: Intrauterine gestational sac: Yes

Yolk sac:  Yes

Embryo:  No

Cardiac Activity: No

Heart Rate: Not applicable  bpm

MSD: 8.7 mm  mm   5 w   5  d

Maternal uterus/adnexae:

Subchorionic hemorrhage: None

Right ovary: None

Left ovary: None

Other :None

Free fluid:  None
IMPRESSION: 1. Probable early intrauterine gestational sac containing yolk sac
but fetal pole, or cardiac activity yet visualized. Recommend
follow-up quantitative B-HCG levels and follow-up US in 14 days to
confirm and assess viability. This recommendation follows SRU
consensus guidelines: Diagnostic Criteria for Nonviable Pregnancy
Early in the First Trimester. N Engl J Med 9556; [DATE].

## 2017-06-03 IMAGING — US US OB TRANSVAGINAL
1 series · 15 of 23 positions shown · non-contrast
Comparison: 03/15/2016

CLINICAL DATA: Pregnant, evaluate for viability

EXAM:
TRANSVAGINAL OB ULTRASOUND
TECHNIQUE: Transvaginal ultrasound was performed for complete evaluation of the
gestation as well as the maternal uterus, adnexal regions, and
pelvic cul-de-sac.

[Series 1: us ob transvaginal · 23 acquisitions, 15 frames shown]
[im 1/23]
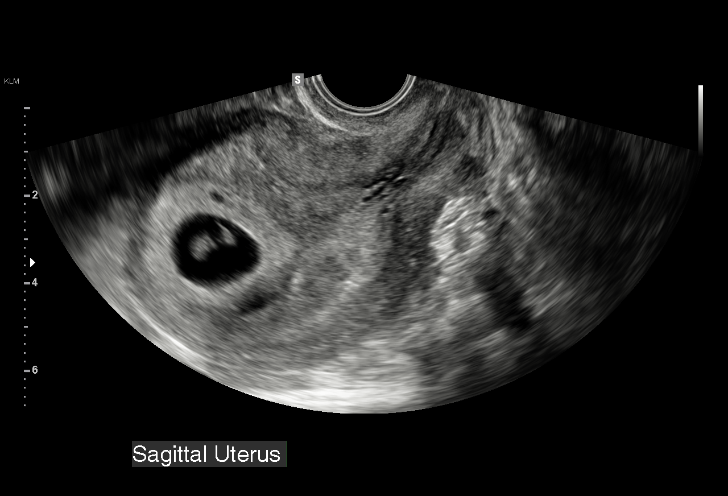
[im 3/23]
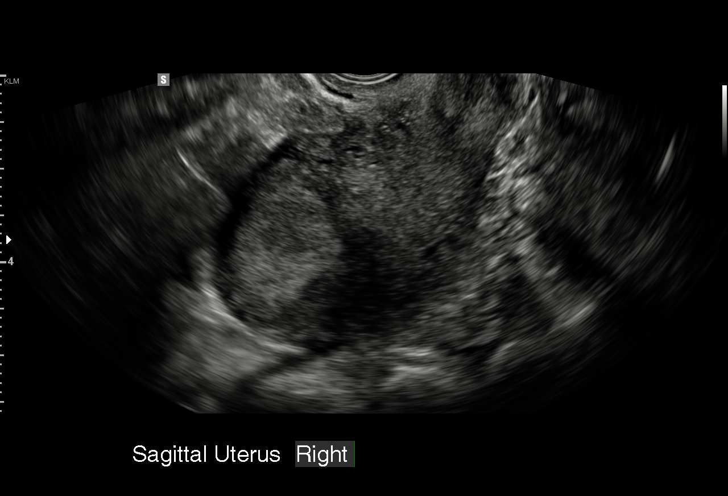
[im 4/23]
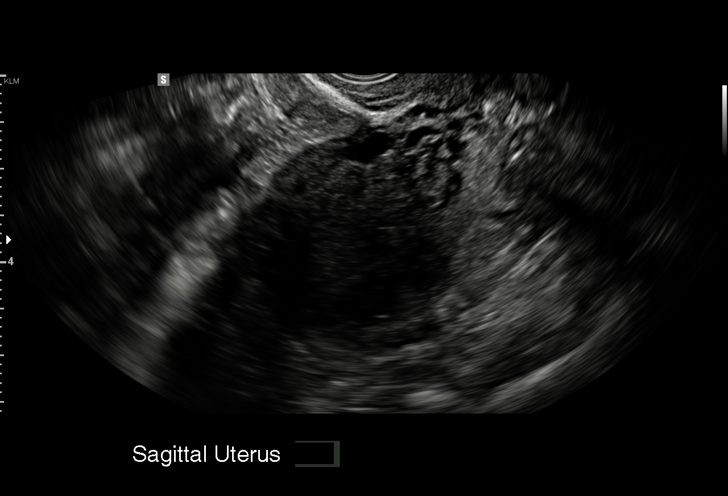
[im 6/23]
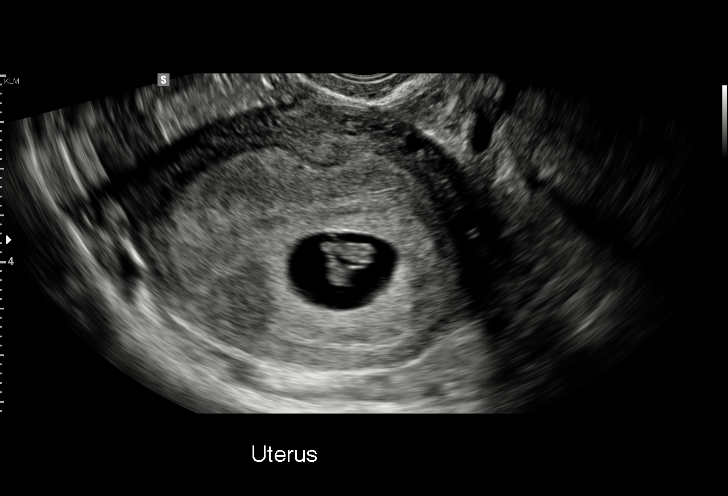
[im 7/23]
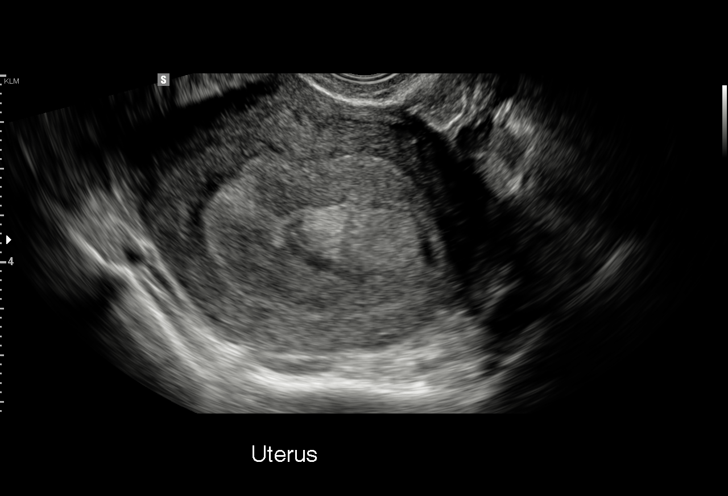
[im 9/23]
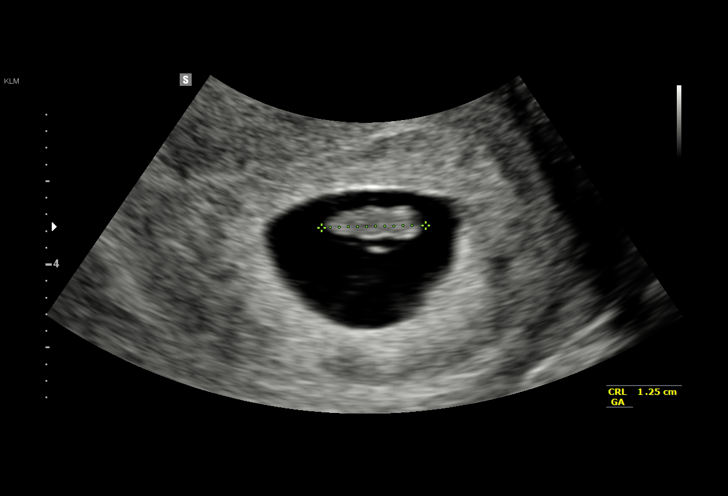
[im 10/23]
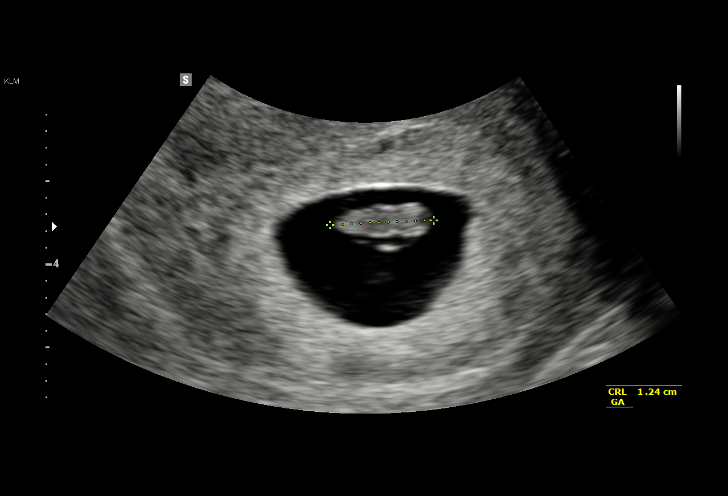
[im 12/23]
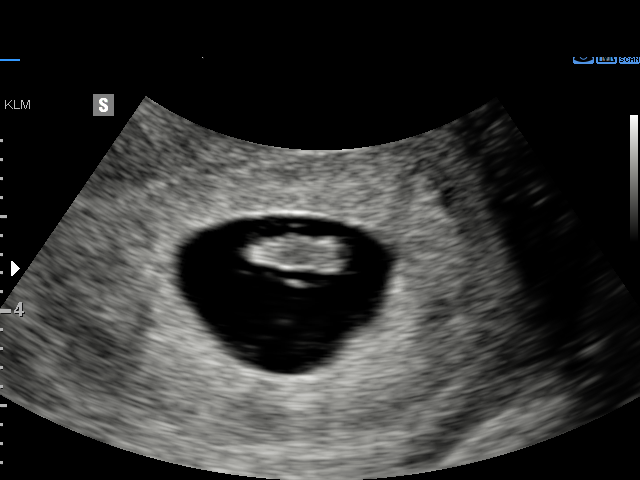
[im 14/23]
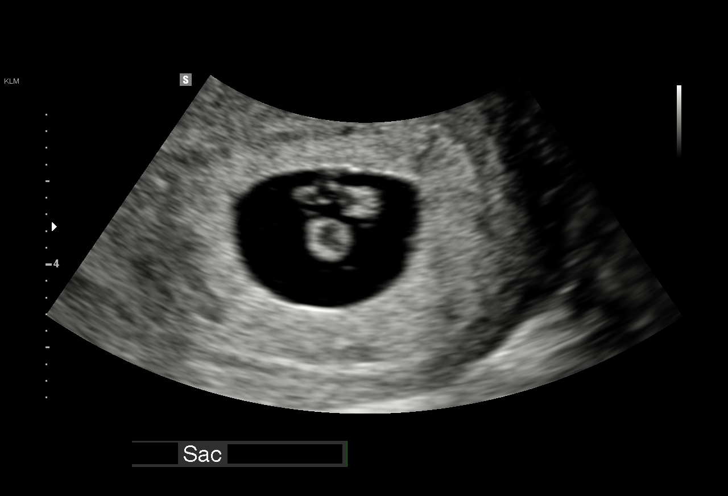
[im 15/23]
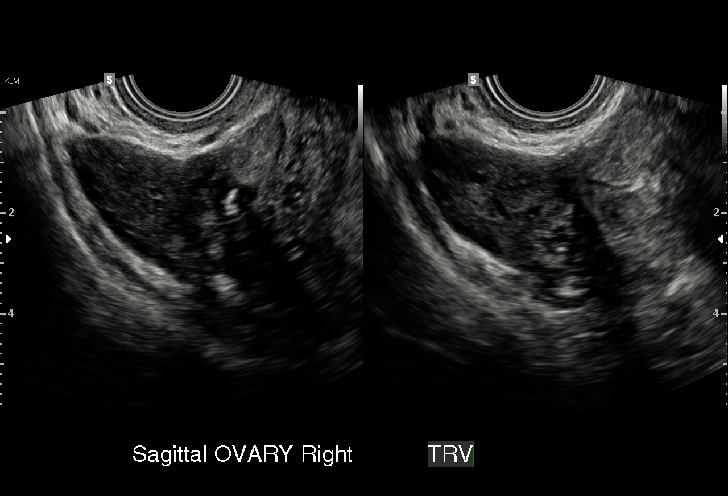
[im 17/23]
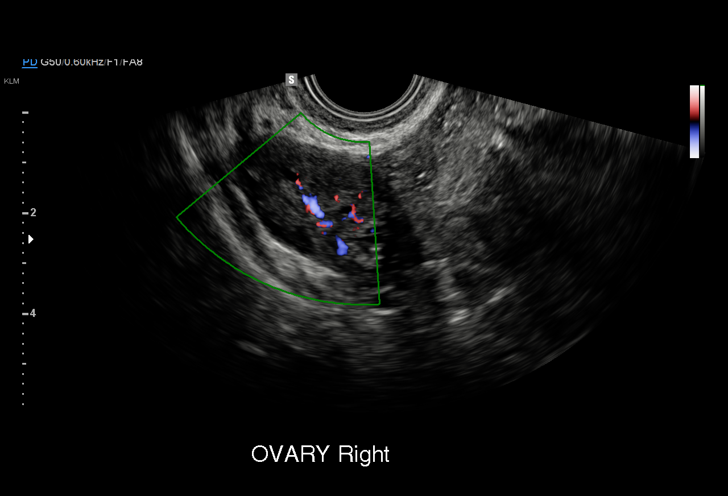
[im 18/23]
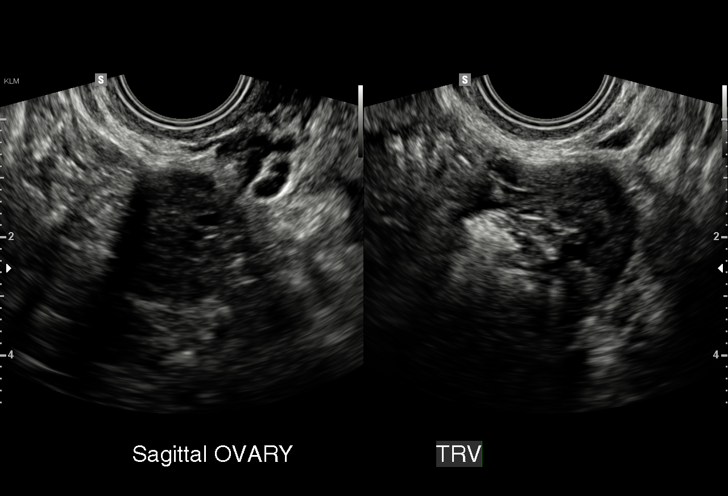
[im 20/23]
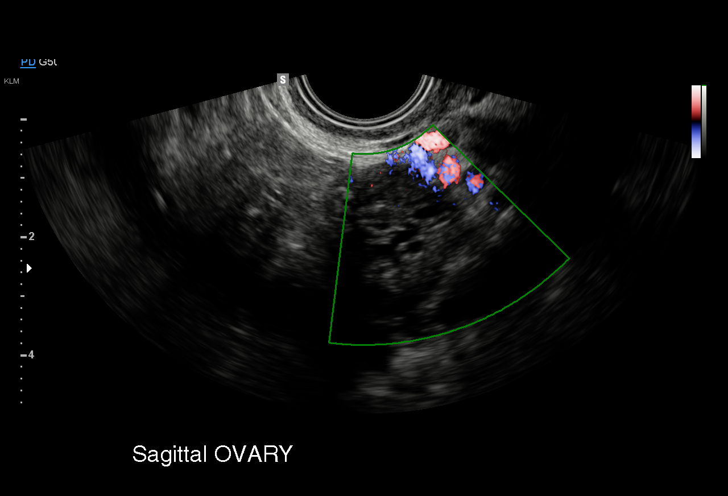
[im 21/23]
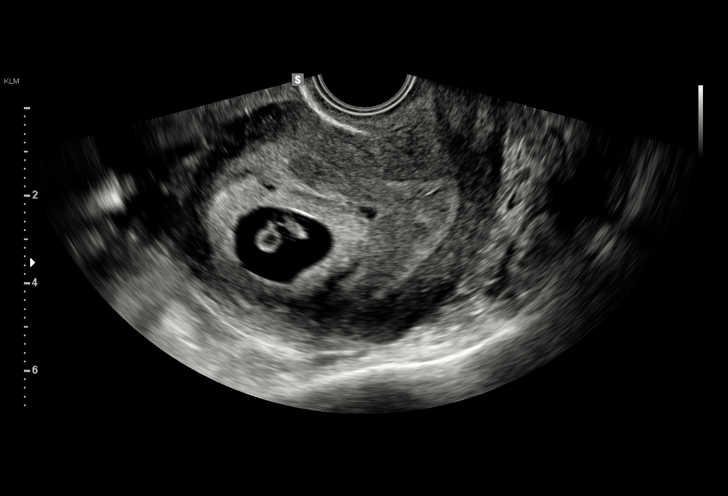
[im 23/23]
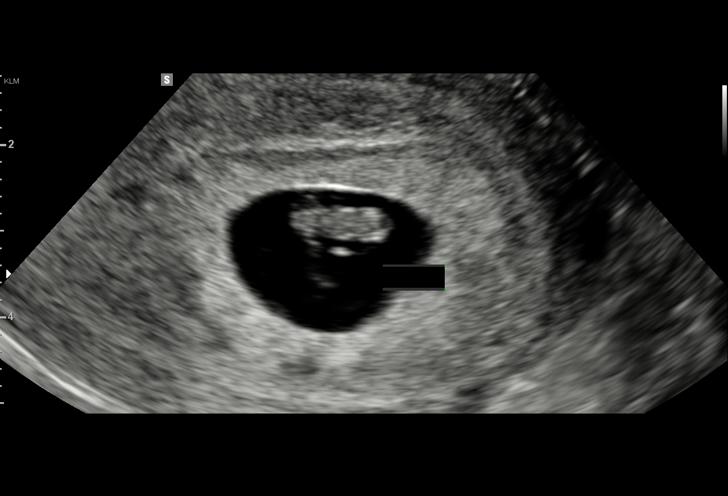

[15 of 23 positions shown; findings below may reference images not displayed]

FINDINGS: Intrauterine gestational sac: Single

Yolk sac:  Present

Embryo:  Present

Cardiac Activity: Present

Heart Rate: 144 bpm

CRL:   12.4  mm   7 w 3 d                  US EDC: 11/12/2016

Subchorionic hemorrhage:  None visualized.

Maternal uterus/adnexae: Bilateral ovaries are within normal limits.

No free fluid.
IMPRESSION: Single live intrauterine gestation, with estimated gestational age 7
weeks 3 days by crown-rump length.

## 2019-09-07 ENCOUNTER — Inpatient Hospital Stay (HOSPITAL_COMMUNITY): Payer: 59

## 2019-09-07 ENCOUNTER — Inpatient Hospital Stay (HOSPITAL_COMMUNITY)
Admission: AD | Admit: 2019-09-07 | Discharge: 2019-09-07 | Disposition: A | Discharge status: Home / Self Care | Payer: 59 | Source: Ambulatory Visit | Attending: Obstetrics and Gynecology | Admitting: Obstetrics and Gynecology

## 2019-09-07 ENCOUNTER — Encounter (HOSPITAL_COMMUNITY): Payer: Self-pay

## 2019-09-07 ENCOUNTER — Other Ambulatory Visit: Payer: Self-pay

## 2019-09-07 DIAGNOSIS — O208 Other hemorrhage in early pregnancy: Secondary | ICD-10-CM | POA: Diagnosis present

## 2019-09-07 DIAGNOSIS — Z3A12 12 weeks gestation of pregnancy: Secondary | ICD-10-CM | POA: Diagnosis not present

## 2019-09-07 DIAGNOSIS — F17210 Nicotine dependence, cigarettes, uncomplicated: Secondary | ICD-10-CM | POA: Diagnosis not present

## 2019-09-07 DIAGNOSIS — O99331 Smoking (tobacco) complicating pregnancy, first trimester: Secondary | ICD-10-CM | POA: Insufficient documentation

## 2019-09-07 DIAGNOSIS — O418X10 Other specified disorders of amniotic fluid and membranes, first trimester, not applicable or unspecified: Secondary | ICD-10-CM

## 2019-09-07 DIAGNOSIS — Z8759 Personal history of other complications of pregnancy, childbirth and the puerperium: Secondary | ICD-10-CM

## 2019-09-07 DIAGNOSIS — O209 Hemorrhage in early pregnancy, unspecified: Secondary | ICD-10-CM

## 2019-09-07 DIAGNOSIS — O468X1 Other antepartum hemorrhage, first trimester: Secondary | ICD-10-CM

## 2019-09-07 LAB — URINALYSIS, ROUTINE W REFLEX MICROSCOPIC
Bilirubin Urine: NEGATIVE
Glucose, UA: NEGATIVE mg/dL
Ketones, ur: NEGATIVE mg/dL
Leukocytes,Ua: NEGATIVE
Nitrite: NEGATIVE
Protein, ur: NEGATIVE mg/dL
RBC / HPF: >50 RBC/hpf — ABNORMAL HIGH (ref 0–5)
Specific Gravity, Urine: 1.01 (ref 1.005–1.030)
pH: 6 (ref 5.0–8.0)

## 2019-09-07 LAB — CBC
HCT: 39 % (ref 36.0–46.0)
Hemoglobin: 13 g/dL (ref 12.0–15.0)
MCH: 31.6 pg (ref 26.0–34.0)
MCHC: 33.3 g/dL (ref 30.0–36.0)
MCV: 94.7 fL (ref 80.0–100.0)
Platelets: 208 10*3/uL (ref 150–400)
RBC: 4.12 MIL/uL (ref 3.87–5.11)
RDW: 13.1 % (ref 11.5–15.5)
WBC: 11.8 10*3/uL — ABNORMAL HIGH (ref 4.0–10.5)
nRBC: 0 % (ref 0.0–0.2)

## 2019-09-07 LAB — HCG, QUANTITATIVE, PREGNANCY: hCG, Beta Chain, Quant, S: 155627 m[IU]/mL — ABNORMAL HIGH (ref <5)

## 2019-09-07 NOTE — Discharge Instructions (Signed)
Subchorionic Hematoma ° °A subchorionic hematoma is a gathering of blood between the outer wall of the embryo (chorion) and the inner wall of the womb (uterus). °This condition can cause vaginal bleeding. If they cause little or no vaginal bleeding, early small hematomas usually shrink on their own and do not affect your baby or pregnancy. When bleeding starts later in pregnancy, or if the hematoma is larger or occurs in older pregnant women, the condition may be more serious. Larger hematomas may get bigger, which increases the chances of miscarriage. This condition also increases the risk of: °· Premature separation of the placenta from the uterus. °· Premature (preterm) labor. °· Stillbirth. °What are the causes? °The exact cause of this condition is not known. It occurs when blood is trapped between the placenta and the uterine wall because the placenta has separated from the original site of implantation. °What increases the risk? °You are more likely to develop this condition if: °· You were treated with fertility medicines. °· You conceived through in vitro fertilization (IVF). °What are the signs or symptoms? °Symptoms of this condition include: °· Vaginal spotting or bleeding. °· Contractions of the uterus. These cause abdominal pain. °Sometimes you may have no symptoms and the bleeding may only be seen when ultrasound images are taken (transvaginal ultrasound). °How is this diagnosed? °This condition is diagnosed based on a physical exam. This includes a pelvic exam. You may also have other tests, including: °· Blood tests. °· Urine tests. °· Ultrasound of the abdomen. °How is this treated? °Treatment for this condition can vary. Treatment may include: °· Watchful waiting. You will be monitored closely for any changes in bleeding. During this stage: °? The hematoma may be reabsorbed by the body. °? The hematoma may separate the fluid-filled space containing the embryo (gestational sac) from the wall of the  womb (endometrium). °· Medicines. °· Activity restriction. This may be needed until the bleeding stops. °Follow these instructions at home: °· Stay on bed rest if told to do so by your health care provider. °· Do not lift anything that is heavier than 10 lbs. (4.5 kg) or as told by your health care provider. °· Do not use any products that contain nicotine or tobacco, such as cigarettes and e-cigarettes. If you need help quitting, ask your health care provider. °· Track and write down the number of pads you use each day and how soaked (saturated) they are. °· Do not use tampons. °· Keep all follow-up visits as told by your health care provider. This is important. Your health care provider may ask you to have follow-up blood tests or ultrasound tests or both. °Contact a health care provider if: °· You have any vaginal bleeding. °· You have a fever. °Get help right away if: °· You have severe cramps in your stomach, back, abdomen, or pelvis. °· You pass large clots or tissue. Save any tissue for your health care provider to look at. °· You have more vaginal bleeding, and you faint or become lightheaded or weak. °Summary °· A subchorionic hematoma is a gathering of blood between the outer wall of the placenta and the uterus. °· This condition can cause vaginal bleeding. °· Sometimes you may have no symptoms and the bleeding may only be seen when ultrasound images are taken. °· Treatment may include watchful waiting, medicines, or activity restriction. °This information is not intended to replace advice given to you by your health care provider. Make sure you discuss any questions you   have with your health care provider. °Document Released: 03/06/2007 Document Revised: 11/01/2017 Document Reviewed: 01/15/2017 °Elsevier Patient Education © 2020 [employer] ° °

## 2019-09-07 NOTE — MAU Provider Note (Addendum)
History     CSN: [id number]  Arrival date and time: 09/07/19 [id number]   First Provider Initiated Contact with Patient 09/07/19 0357     Chief Complaint  Patient presents with  . Vaginal Bleeding   HPI Vicki Hooper is a 23 y.o. G2P0010 at 4683w1d who presents to MAU with chief complaint of vaginal bleeding and abdominal cramping.   Vaginal bleeding This is a new problem, onset early this morning. Patient endorses waking up to void and seeing blood in her commode. She experienced an ongoing trickle which intensified in flow within the past hour.   Abdominal and back pain These are new problems, onset within the past hour or two and coinciding with onset of heavy bleeding. Patient describes an attendant urge to push through the cramp similar to constipation. She rates her pain at both sites as 4/10, no radiating, denies aggravating or alleviating factors. She has not taken medication or tried other treatments for this complaint.  Patient's history is significant for late first trimester loss. She is compliant with advised vaginal progesterone.   OB History    Gravida  2   Para      Term      Preterm      AB  1   Living        SAB  1   TAB      Ectopic      Multiple      Live Births              History reviewed. No pertinent past medical history.  Past Surgical History:  Procedure Laterality Date  . MOUTH SURGERY    . WRIST SURGERY Left     Family History  Problem Relation Age of Onset  . Hypertension Mother   . Diabetes Maternal Grandfather   . Hypertension Maternal Grandfather   . Diabetes Paternal Grandfather   . Diabetes Sister     Social History   Tobacco Use  . Smoking status: Current Some Day Smoker    Packs/day: 0.25    Years: 3.00    Pack years: 0.75    Types: Cigarettes    Last attempt to quit: 02/01/2016    Years since quitting: 3.6  . Smokeless tobacco: Never Used  Substance Use Topics  . Alcohol use: No    Alcohol/week: 0.0  standard drinks  . Drug use: No    Allergies: No Known Allergies  Medications Prior to Admission  Medication Sig Dispense Refill Last Dose  . acetaminophen (TYLENOL) 500 MG tablet Take 1,000 mg by mouth every 6 (six) hours as needed for mild pain or headache.   Past Week at Unknown time  . Prenatal Vit-Fe Fumarate-FA (PRENATAL MULTIVITAMIN) TABS tablet Take 1 tablet by mouth every evening.   09/06/2019 at Unknown time  . progesterone (PROMETRIUM) 200 MG capsule Place 200 mg vaginally daily.   09/06/2019 at Unknown time  . azithromycin (ZITHROMAX) 1 g powder Take 1 packet (1 g total) by mouth once. 1 each 0   . promethazine (PHENERGAN) 25 MG tablet Take 1 tablet (25 mg total) by mouth every 6 (six) hours as needed for nausea or vomiting. 30 tablet 2     Review of Systems  Constitutional: Negative for chills, fatigue and fever.  Respiratory: Negative for shortness of breath.   Gastrointestinal: Positive for abdominal pain.  Genitourinary: Positive for vaginal bleeding.  Musculoskeletal: Negative for back pain.  Neurological: Negative for dizziness, syncope and weakness.  All other  systems reviewed and are negative.  Physical Exam   Blood pressure 120/75, pulse 85, temperature 98.3 F (36.8 C), temperature source Oral, resp. rate 18, height 5\' 6"  (1.676 m), weight 64.6 kg, SpO2 100 %, unknown if currently breastfeeding.  Physical Exam  Nursing note and vitals reviewed. Constitutional: She is oriented to person, place, and time. She appears well-developed and well-nourished.  Cardiovascular: Normal rate.  Respiratory: Effort normal.  GI: Soft. She exhibits no distension. There is no abdominal tenderness. There is no rebound and no guarding.  Genitourinary:    Vaginal discharge present.     Genitourinary Comments: Small amount dark red bleeding removed with fox swab x 1   Neurological: She is alert and oriented to person, place, and time.  Skin: Skin is warm and dry.  Psychiatric:  She has a normal mood and affect. Her behavior is normal. Judgment and thought content normal.    MAU Course/MDM  Procedures: sterile speculum exam  --Patient previously advised on pelvic rest. Aware of activity constraints for subchorionic hematoma  Patient Vitals for the past 24 hrs:  BP Temp Temp src Pulse Resp SpO2 Height Weight  09/07/19 0527 119/64 - - 77 18 - - -  09/07/19 0350 120/75 98.3 F (36.8 C) Oral 85 18 100 % - -  09/07/19 0344 - - - - - - 5\' 6"  (1.676 m) 64.6 kg   Results for orders placed or performed during the hospital encounter of 09/07/19 (from the past 24 hour(s))  CBC     Status: Abnormal   Collection Time: 09/07/19  3:59 AM  Result Value Ref Range   WBC 11.8 (H) 4.0 - 10.5 K/uL   RBC 4.12 3.87 - 5.11 MIL/uL   Hemoglobin 13.0 12.0 - 15.0 g/dL   HCT 39.0 36.0 - 46.0 %   MCV 94.7 80.0 - 100.0 fL   MCH 31.6 26.0 - 34.0 pg   MCHC 33.3 30.0 - 36.0 g/dL   RDW 13.1 11.5 - 15.5 %   Platelets 208 150 - 400 K/uL   nRBC 0.0 0.0 - 0.2 %  Urinalysis, Routine w reflex microscopic     Status: Abnormal   Collection Time: 09/07/19  3:59 AM  Result Value Ref Range   Color, Urine YELLOW YELLOW   APPearance CLEAR CLEAR   Specific Gravity, Urine 1.010 1.005 - 1.030   pH 6.0 5.0 - 8.0   Glucose, UA NEGATIVE NEGATIVE mg/dL   Hgb urine dipstick LARGE (A) NEGATIVE   Bilirubin Urine NEGATIVE NEGATIVE   Ketones, ur NEGATIVE NEGATIVE mg/dL   Protein, ur NEGATIVE NEGATIVE mg/dL   Nitrite NEGATIVE NEGATIVE   Leukocytes,Ua NEGATIVE NEGATIVE   RBC / HPF >50 (H) 0 - 5 RBC/hpf   WBC, UA 0-5 0 - 5 WBC/hpf   Bacteria, UA FEW (A) NONE SEEN   Squamous Epithelial / LPF 0-5 0 - 5   Mucus PRESENT    US Ob Comp Less 14 Wks  Result Date: 09/07/2019 CLINICAL DATA:  Vaginal bleeding and pregnancy, first trimester EXAM: OBSTETRIC <14 WK ULTRASOUND TECHNIQUE: Transabdominal ultrasound was performed for evaluation of the gestation as well as the maternal uterus and adnexal regions.  COMPARISON:  None of this gestation FINDINGS: Intrauterine gestational sac: Single Yolk sac:  Not Visualized. Embryo:  Visualized. Cardiac Activity: Visualized. Heart Rate: 170 bpm CRL:   56.7 mm   12 w 1 d                  [place]  EDC: 03/20/2020 Subchorionic hemorrhage: A large hematoma is noted along the left aspect of the gestational sac, up to 21 mm in thickness and encompassing a 7 cm circumference of gestational sac, nearly 50% on some images. Maternal uterus/adnexae: Negative ovaries. IMPRESSION: 1. Single living intrauterine pregnancy measuring 12 weeks 1 day. 2. Large subchronic hemorrhage along the left aspect of the sac, as above. Electronically Signed   By: Marnee Spring M.D.   On: 09/07/2019 05:16   Assessment and Plan  --23 y.o. G2P0010 at [redacted]w[redacted]d  --Continue pelvic rest as previously advised --Blood type O POS --Discharge home in stable condition  Calvert Cantor, CNM 09/07/2019, 6:03 AM

## 2019-09-07 NOTE — MAU Note (Signed)
Pt reports she woke up and noticed a heavy amount of red blood. Reports that it keeps trickling out. Did not have pads at home, but has been using tissue that she reports having to change pretty frequently. She reports some abdominal cramping that started soon after. Feels like "constipation cramps." Has a history of a 12-13 week SAB.

## 2020-11-11 IMAGING — US US OB COMP LESS 14 WK
1 series · 15 of 19 positions shown · non-contrast
Comparison: None of this gestation

CLINICAL DATA: Vaginal bleeding and pregnancy, first trimester

EXAM:
OBSTETRIC <14 WK ULTRASOUND
TECHNIQUE: Transabdominal ultrasound was performed for evaluation of the
gestation as well as the maternal uterus and adnexal regions.

[Series 1: us ob comp less 14 wk · 19 acquisitions, 15 frames shown]
[im 1/19]
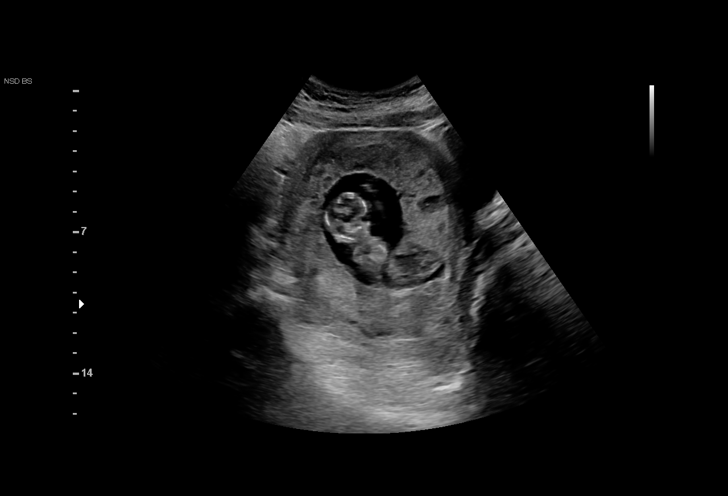
[im 2/19]
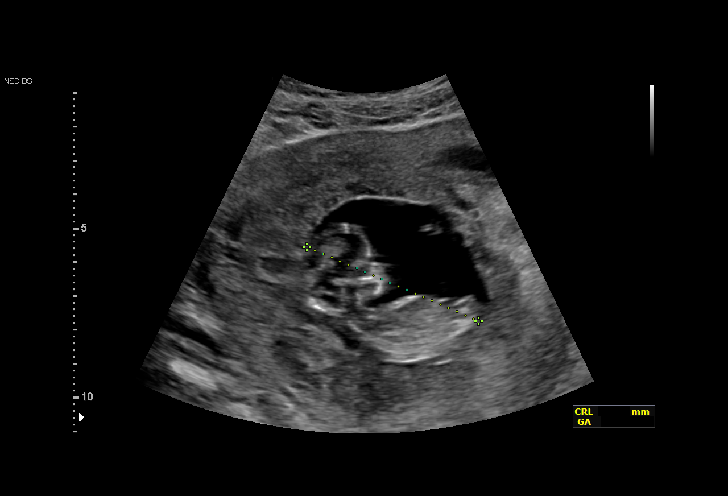
[im 4/19]
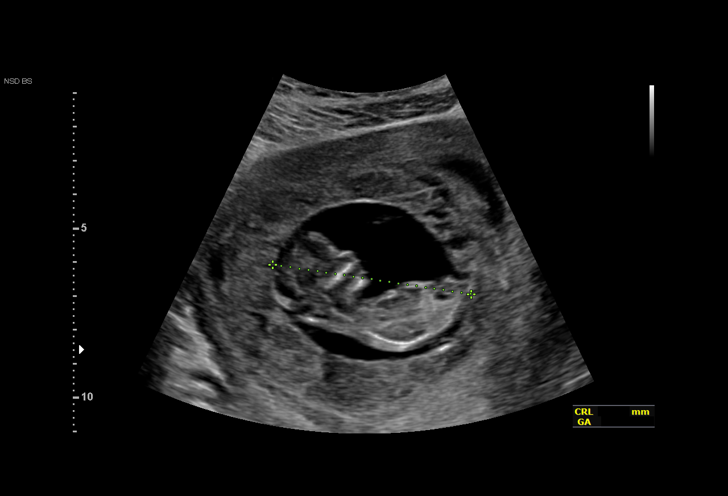
[im 5/19]
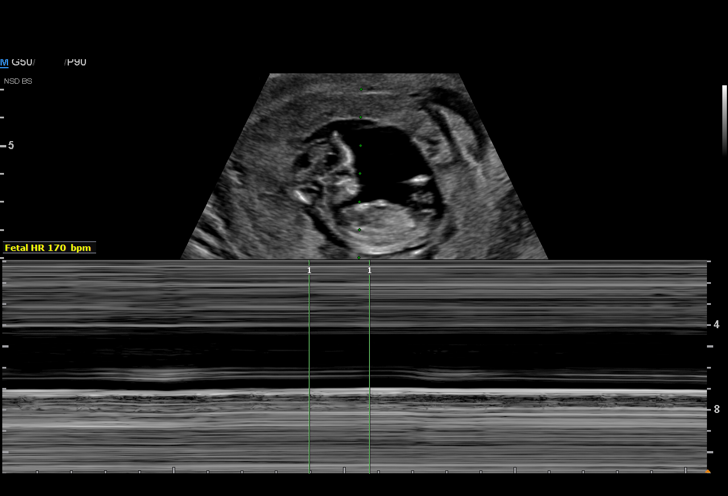
[im 6/19]
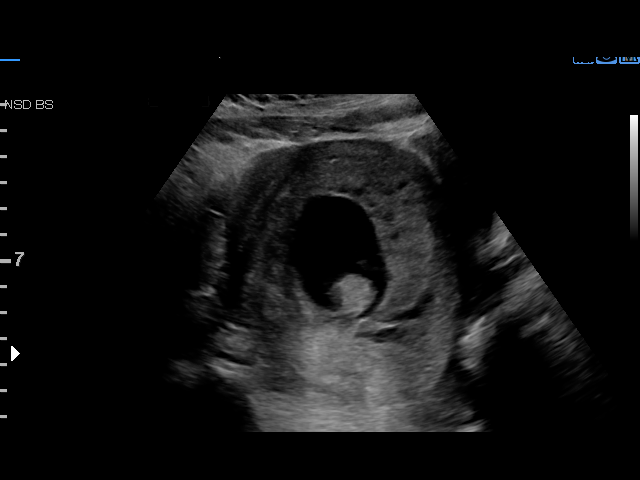
[im 7/19]
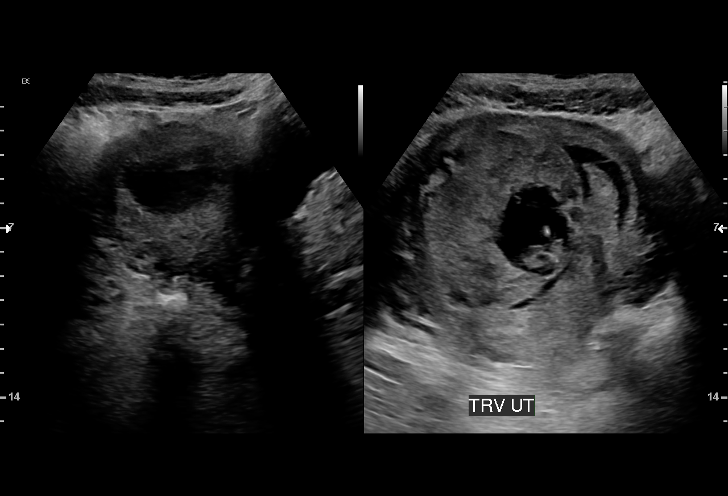
[im 9/19]
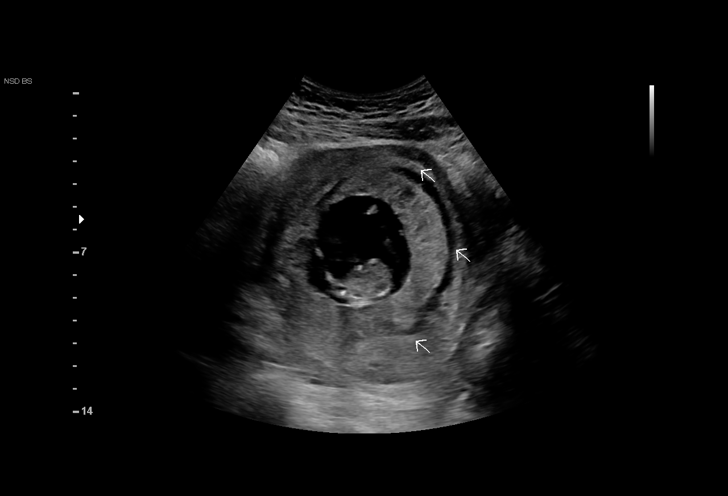
[im 10/19]
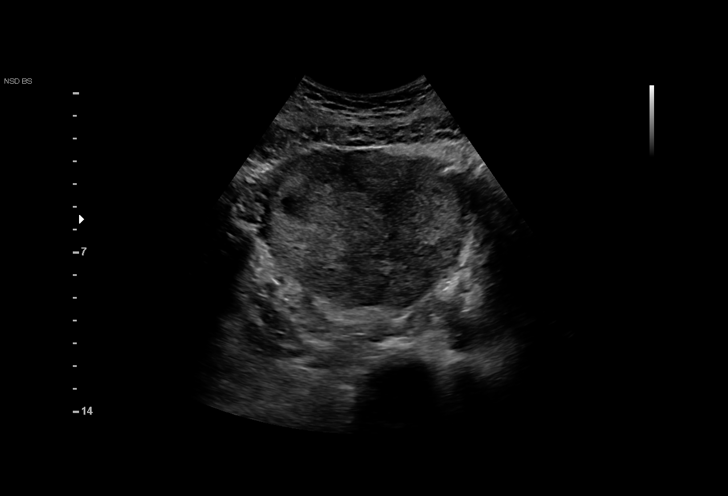
[im 11/19]
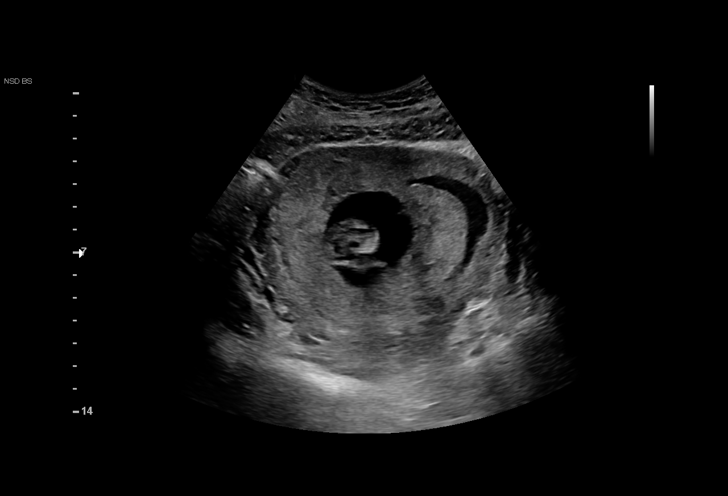
[im 13/19]
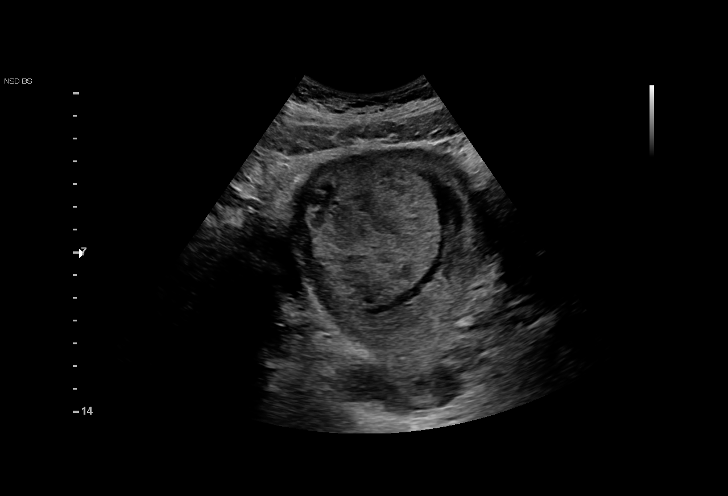
[im 14/19]
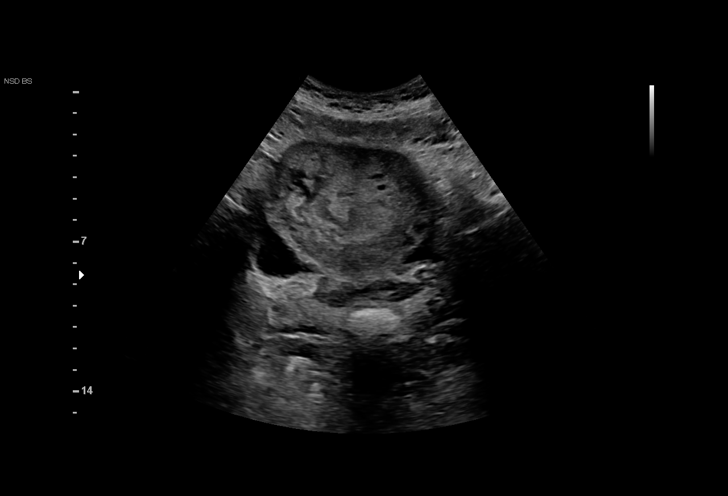
[im 15/19]
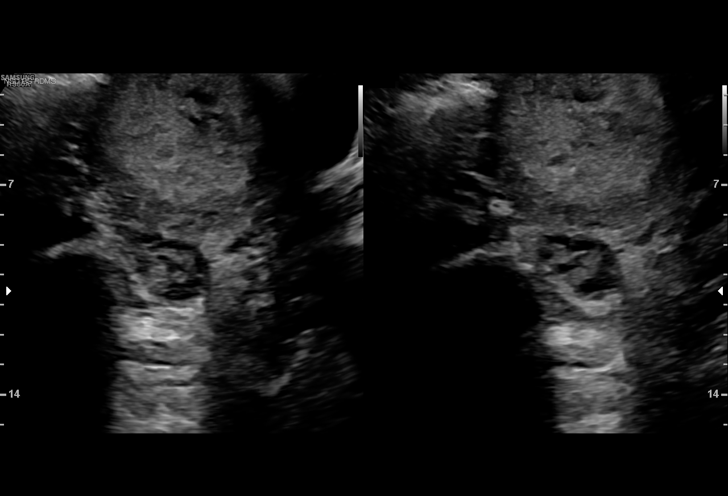
[im 16/19]
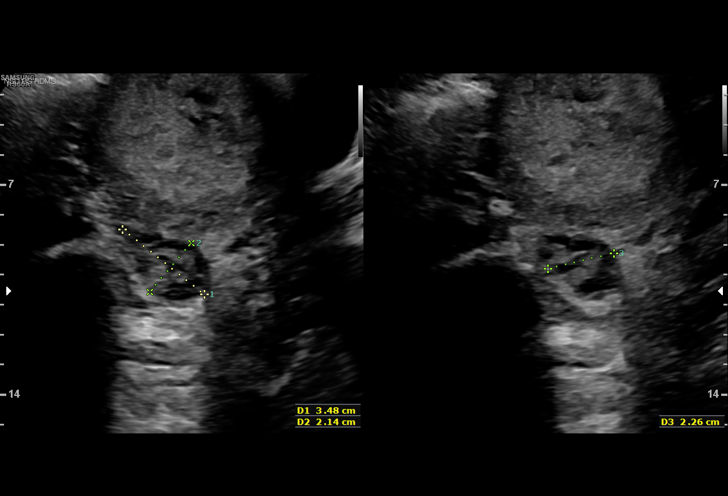
[im 18/19]
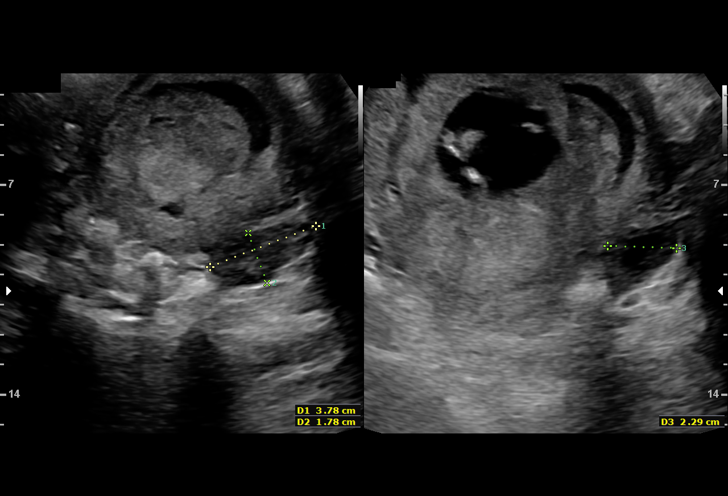
[im 19/19]
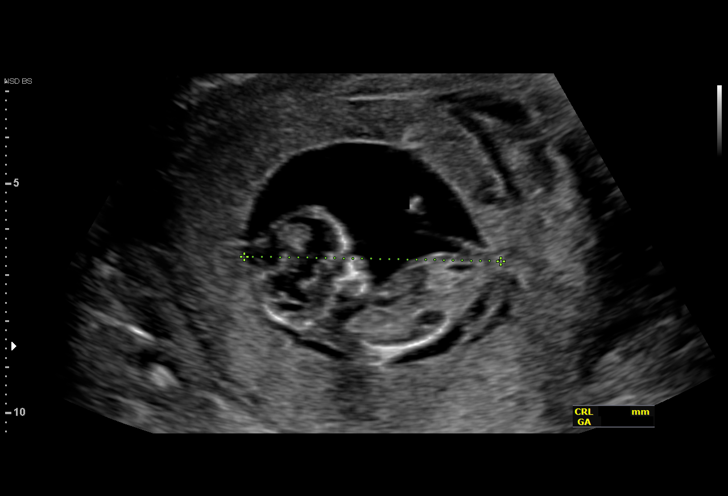

[15 of 19 positions shown; findings below may reference images not displayed]

FINDINGS: Intrauterine gestational sac: Single

Yolk sac:  Not Visualized.

Embryo:  Visualized.

Cardiac Activity: Visualized.

Heart Rate: 170 bpm

CRL:   56.7 mm   12 w 1 d                  US EDC: 03/20/2020

Subchorionic hemorrhage: A large hematoma is noted along the left
aspect of the gestational sac, up to 21 mm in thickness and
encompassing a 7 cm circumference of gestational sac, nearly 50% on
some images.

Maternal uterus/adnexae: Negative ovaries.
IMPRESSION: 1. Single living intrauterine pregnancy measuring 12 weeks 1 day.
2. Large subchronic hemorrhage along the left aspect of the sac, as
above.

## 2020-12-03 NOTE — L&D Delivery Note (Signed)
Delivery Note I was called at 231pm and notified pt was c/c/+3.  I immediately headed to hospital but 6 minutes later and was called and told baby had already delivered.  RN reports that at 2:35 PM a viable female was delivered without the patient pushing onto the bed via Vaginal, Spontaneous (Presentation:  OP    ).  APGAR: per NICU; weight 1 lb 11.5 oz (780 g).  The in-house midwife was in room gloving at time of delivery and cut cord, handing baby off to the NICU team who was in room within 1 minute of birth.  When I arrived the placenta was still in utero and with gentle traction delivered spontaneously.  A manual check revealed some likely retained placental tissue, and pt had some moderate bleeding, but not excessive.  Bedside US was called and the epidural boosted.  A banjo curette was used and under US guidance several placental fragments removed. At the conclusion of the curettage when no further tissue noted, vaginal [place] was performed and stripe appeared normal with largest width 1.3cm.  Pt was given methergine x 1 and bleeding was much improved.   Placenta status: Pathology, required curettage.   Cord:   with the following complications: None.    Anesthesia: Epidural Episiotomy: None Lacerations:  intact  Suture Repair: n/a Est. Blood Loss (mL):   Mom to postpartum.  Baby to NICU. Baby intubated in delivery room and stabilized well.   Charlesetta Garibaldi Kooistra 06/15/2021, 3:20 PM

## 2021-02-14 ENCOUNTER — Other Ambulatory Visit: Payer: Self-pay | Admitting: Obstetrics and Gynecology

## 2021-03-13 ENCOUNTER — Other Ambulatory Visit: Payer: Self-pay | Admitting: Obstetrics and Gynecology

## 2021-05-23 ENCOUNTER — Encounter: Payer: Self-pay | Admitting: Obstetrics and Gynecology

## 2021-05-24 ENCOUNTER — Other Ambulatory Visit: Payer: Self-pay | Admitting: Obstetrics and Gynecology

## 2021-05-24 DIAGNOSIS — O43102 Malformation of placenta, unspecified, second trimester: Secondary | ICD-10-CM

## 2021-05-24 DIAGNOSIS — Z363 Encounter for antenatal screening for malformations: Secondary | ICD-10-CM

## 2021-05-24 DIAGNOSIS — O4102X1 Oligohydramnios, second trimester, fetus 1: Secondary | ICD-10-CM

## 2021-05-24 DIAGNOSIS — Z3A22 22 weeks gestation of pregnancy: Secondary | ICD-10-CM

## 2021-05-25 ENCOUNTER — Ambulatory Visit: Payer: BC Managed Care – PPO | Attending: Obstetrics and Gynecology

## 2021-05-25 ENCOUNTER — Ambulatory Visit (HOSPITAL_BASED_OUTPATIENT_CLINIC_OR_DEPARTMENT_OTHER): Payer: BC Managed Care – PPO | Admitting: Maternal & Fetal Medicine

## 2021-05-25 ENCOUNTER — Encounter: Payer: Self-pay | Admitting: *Deleted

## 2021-05-25 ENCOUNTER — Other Ambulatory Visit: Payer: Self-pay

## 2021-05-25 ENCOUNTER — Ambulatory Visit: Payer: BC Managed Care – PPO | Admitting: *Deleted

## 2021-05-25 VITALS — BP 121/67 | HR 79

## 2021-05-25 DIAGNOSIS — Z3A22 22 weeks gestation of pregnancy: Secondary | ICD-10-CM | POA: Insufficient documentation

## 2021-05-25 DIAGNOSIS — O4100X0 Oligohydramnios, unspecified trimester, not applicable or unspecified: Secondary | ICD-10-CM

## 2021-05-25 DIAGNOSIS — O43102 Malformation of placenta, unspecified, second trimester: Secondary | ICD-10-CM | POA: Insufficient documentation

## 2021-05-25 DIAGNOSIS — Z3A00 Weeks of gestation of pregnancy not specified: Secondary | ICD-10-CM

## 2021-05-25 DIAGNOSIS — O4102X1 Oligohydramnios, second trimester, fetus 1: Secondary | ICD-10-CM | POA: Diagnosis not present

## 2021-05-25 DIAGNOSIS — O4442 Low lying placenta NOS or without hemorrhage, second trimester: Secondary | ICD-10-CM | POA: Diagnosis present

## 2021-05-25 DIAGNOSIS — O4102X0 Oligohydramnios, second trimester, not applicable or unspecified: Secondary | ICD-10-CM | POA: Diagnosis not present

## 2021-05-25 DIAGNOSIS — O4692 Antepartum hemorrhage, unspecified, second trimester: Secondary | ICD-10-CM

## 2021-05-25 DIAGNOSIS — Z363 Encounter for antenatal screening for malformations: Secondary | ICD-10-CM | POA: Diagnosis present

## 2021-05-25 NOTE — Progress Notes (Signed)
MFM Consultation  Vicki Hooper is a G3P0 who is here at the request of Dr. Mindi Slicker regarding an ultrasound finding of subchorionic hemorrhage and oligohydramnios.  Vicki Hooper is dated by an early ultrasound with an EDD of 09/27/21. This report was not available at the time of the visit. Her pregnancy is complicated by early bleeding with a suggestion of floating amniotic and subchorionic hemorrhage.   Her prenatal care is standard with early nausea. She had a low risk NIPS, AFP or carrier screening was not obtained.  In addition, her pregnancy history is included two spontaneous SAB's.  She denies significant medical history or surgery. She denies substance abuse or family history of birth defects. She takes prenatal vitamins and diclegis.   Today we obseverved a normally growth fetus with measurements consistent with dates. In addition, we observed large subamniotic placental lakes and areas suggestive of placental subchorionic hemorrhage throughout the placenta. There was also significant oligohydramnios but a full stomach and bladder. The kidneys appear normal as well. There were thin whispy structures near the uterine wall suggesting amnion separation.  I discussed with Vicki Hooper the differential diagnosis includes abnormal placentation, fetal growth restriction, premature rupture of membranes, dehydration, renal agenesis  or obstructive uropathy. Other items include placental insufficiency, hypertension and antiphospholipid antibody syndrome.  She denies leakage of fluid or bleeding.   Given the above findings it appears that this may be likely due to abnormal placentation or placental insufficiency. She has a normal NIPT. I discussed that barring sealed amniotic sac, serial fetal monitoring and PROM screening is recommended. We discussed that the increased risk of perinatal mortality rate increased with oligohydramnios. In particular with this finding occuring so early there is an increased risk  for pulmonary hypoplasia.   I explained that some data suggest an increased risk of FGR and if present an increased risk for stilbirth, neontal admission, neonatal death and meconium stained amniotic fluid. At this time I explained that the etiology for the oligoyhdramnios is uncertain and the serial assessments and growth are the mainstay of care, in addition to maternal hydration.  RECOMMENDATION: At this time we recommend definitive monitoring of PROM with amnisure in 1 week. Repeat AFI in 2 weeks,  Repeat growth in 4 weeks. NICU visit at the time of viability.  Maternal hydration of 2 L per day.  I spent 45 minutes with > 50% in face to face consultation.  [employer],

## 2021-05-26 ENCOUNTER — Other Ambulatory Visit: Payer: Self-pay | Admitting: *Deleted

## 2021-05-26 DIAGNOSIS — O4102X0 Oligohydramnios, second trimester, not applicable or unspecified: Secondary | ICD-10-CM

## 2021-05-29 ENCOUNTER — Other Ambulatory Visit: Payer: Self-pay | Admitting: *Deleted

## 2021-05-29 DIAGNOSIS — O4100X0 Oligohydramnios, unspecified trimester, not applicable or unspecified: Secondary | ICD-10-CM

## 2021-05-30 ENCOUNTER — Other Ambulatory Visit: Payer: Self-pay

## 2021-06-01 ENCOUNTER — Inpatient Hospital Stay (HOSPITAL_COMMUNITY)
Admission: AD | Admit: 2021-06-01 | Discharge: 2021-06-01 | Disposition: A | Discharge status: Home / Self Care | Payer: BC Managed Care – PPO | Attending: Obstetrics and Gynecology | Admitting: Obstetrics and Gynecology

## 2021-06-01 ENCOUNTER — Encounter (HOSPITAL_COMMUNITY): Payer: Self-pay | Admitting: Obstetrics and Gynecology

## 2021-06-01 DIAGNOSIS — Z79899 Other long term (current) drug therapy: Secondary | ICD-10-CM | POA: Diagnosis not present

## 2021-06-01 DIAGNOSIS — O4102X0 Oligohydramnios, second trimester, not applicable or unspecified: Secondary | ICD-10-CM | POA: Insufficient documentation

## 2021-06-01 DIAGNOSIS — O42912 Preterm premature rupture of membranes, unspecified as to length of time between rupture and onset of labor, second trimester: Secondary | ICD-10-CM | POA: Insufficient documentation

## 2021-06-01 DIAGNOSIS — Z0371 Encounter for suspected problem with amniotic cavity and membrane ruled out: Secondary | ICD-10-CM | POA: Diagnosis not present

## 2021-06-01 DIAGNOSIS — O26892 Other specified pregnancy related conditions, second trimester: Secondary | ICD-10-CM | POA: Diagnosis not present

## 2021-06-01 DIAGNOSIS — Z7982 Long term (current) use of aspirin: Secondary | ICD-10-CM | POA: Insufficient documentation

## 2021-06-01 DIAGNOSIS — Z3A23 23 weeks gestation of pregnancy: Secondary | ICD-10-CM | POA: Diagnosis not present

## 2021-06-01 DIAGNOSIS — Z87891 Personal history of nicotine dependence: Secondary | ICD-10-CM | POA: Insufficient documentation

## 2021-06-01 LAB — POCT FERN TEST: POCT Fern Test: NEGATIVE

## 2021-06-01 LAB — AMNISURE RUPTURE OF MEMBRANE (ROM) NOT AT ARMC: Amnisure ROM: NEGATIVE

## 2021-06-01 NOTE — MAU Provider Note (Signed)
History     CSN: [id number]  Arrival date and time: 06/01/21 1405   Event Date/Time   First Provider Initiated Contact with Patient 06/01/21 1453      Chief Complaint  Patient presents with   Rupture of Membranes    Rule out SROM    25 y.o. G3P0020 @23 .1 wks presenting for amnisure in the setting of Oligo. She was seen by MFM on 05/25/21 and found to have oligohydramnios. Recommendation by MFM was to obtain Amnisure after 1 week. She denies LOF. No VB or ctx. Reports good FM.   OB History     Gravida  3   Para      Term      Preterm      AB  2   Living         SAB  2   IAB      Ectopic      Multiple      Live Births              History reviewed. No pertinent past medical history.  Past Surgical History:  Procedure Laterality Date   MOUTH SURGERY     WRIST SURGERY Left     Family History  Problem Relation Age of Onset   Hypertension Mother    Diabetes Maternal Grandfather    Hypertension Maternal Grandfather    Diabetes Paternal Grandfather    Diabetes Sister     Social History   Tobacco Use   Smoking status: Former    Packs/day: 0.25    Years: 3.00    Pack years: 0.75    Types: Cigarettes    Quit date: 02/01/2016    Years since quitting: 5.3   Smokeless tobacco: Never  Vaping Use   Vaping Use: Never used  Substance Use Topics   Alcohol use: No    Alcohol/week: 0.0 standard drinks   Drug use: No    Allergies: No Known Allergies  Medications Prior to Admission  Medication Sig Dispense Refill Last Dose   aspirin EC 81 MG tablet Take 81 mg by mouth daily. Swallow whole.   05/31/2021   Prenatal Vit-Fe Fumarate-FA (PRENATAL MULTIVITAMIN) TABS tablet Take 1 tablet by mouth every evening.   06/01/2021   acetaminophen (TYLENOL) 500 MG tablet Take 1,000 mg by mouth every 6 (six) hours as needed for mild pain or headache.      azithromycin (ZITHROMAX) 1 g powder Take 1 packet (1 g total) by mouth once. 1 each 0    progesterone  (PROMETRIUM) 200 MG capsule Place 200 mg vaginally daily.      promethazine (PHENERGAN) 25 MG tablet Take 1 tablet (25 mg total) by mouth every 6 (six) hours as needed for nausea or vomiting. 30 tablet 2     Review of Systems  Gastrointestinal:  Negative for abdominal pain.  Genitourinary:  Negative for vaginal bleeding and vaginal discharge.  Physical Exam   Blood pressure (!) 121/57, pulse 72, temperature 98.8 F (37.1 C), temperature source Oral, resp. rate 16, height 5\' 6"  (1.676 m), weight 75.3 kg, last menstrual period 12/08/2020, SpO2 97 %, unknown if currently breastfeeding.  Physical Exam Vitals and nursing note reviewed. Exam conducted with a chaperone present.  Constitutional:      General: She is not in acute distress.    Appearance: Normal appearance.  HENT:     Head: Normocephalic and atraumatic.  Cardiovascular:     Rate and Rhythm: Normal rate.  Pulmonary:  Effort: Pulmonary effort is normal. No respiratory distress.  Abdominal:     Palpations: Abdomen is soft.     Tenderness: There is no abdominal tenderness.     Comments: gravid  Genitourinary:    Comments: SSE: no pool, fern neg Musculoskeletal:        General: Normal range of motion.     Cervical back: Normal range of motion.  Skin:    General: Skin is warm and dry.  Neurological:     General: No focal deficit present.     Mental Status: She is alert and oriented to person, place, and time.  Psychiatric:        Mood and Affect: Mood normal.        Behavior: Behavior normal.  EFM: 150 bpm, mod variability, + accels, no decels Toco: none  Results for orders placed or performed during the hospital encounter of 06/01/21 (from the past 24 hour(s))  Amnisure rupture of membrane (rom)not at [facility]     Status: None   Collection Time: 06/01/21  3:14 PM  Result Value Ref Range   Amnisure ROM NEGATIVE   POCT fern test     Status: Normal   Collection Time: 06/01/21  3:29 PM  Result Value Ref Range   POCT  Fern Test Negative = intact amniotic membranes    MAU Course  Procedures  MDM Prenatal records reviewed. Pregnancy complicated by Oligo, placental lakes, and tobacco use. Labs ordered and reviewed. No signs of PROM. Stable for discharge home.   Assessment and Plan   1. [redacted] weeks gestation of pregnancy   2. Oligohydramnios in second trimester, single or unspecified fetus   3. Encounter for suspected premature rupture of membranes, with rupture of membranes not found    Discharge home Follow up at [facility] and MFM next week PTL precautions Continue good oral hydration  Allergies as of 06/01/2021   No Known Allergies      Medication List     STOP taking these medications    azithromycin 1 g powder Commonly known as: ZITHROMAX   promethazine 25 MG tablet Commonly known as: PHENERGAN       TAKE these medications    acetaminophen 500 MG tablet Commonly known as: TYLENOL Take 1,000 mg by mouth every 6 (six) hours as needed for mild pain or headache.   aspirin EC 81 MG tablet Take 81 mg by mouth daily. Swallow whole.   prenatal multivitamin Tabs tablet Take 1 tablet by mouth every evening.   progesterone 200 MG capsule Commonly known as: PROMETRIUM Place 200 mg vaginally daily.       Donette Larry, CNM 06/01/2021, 3:43 PM

## 2021-06-01 NOTE — MAU Note (Signed)
Patient arrived to MAU sent from [facility] office. Patient said that her provider stated that her fluid was low, and she was sent her to rule out SROM.

## 2021-06-02 ENCOUNTER — Encounter (HOSPITAL_COMMUNITY): Payer: Self-pay

## 2021-06-02 NOTE — Progress Notes (Signed)
Neonatal consultation with Dr. Ruben Gottron scheduled with patient in MAU conference room  on June 06, 2021 at 1:00. MAU charge nurse notified. Thank you for the consult.

## 2021-06-04 ENCOUNTER — Encounter (HOSPITAL_COMMUNITY): Payer: Self-pay | Admitting: Obstetrics and Gynecology

## 2021-06-04 ENCOUNTER — Inpatient Hospital Stay (HOSPITAL_COMMUNITY)
Admission: AD | Admit: 2021-06-04 | Discharge: 2021-06-04 | Disposition: A | Discharge status: Home / Self Care | Payer: BC Managed Care – PPO | Attending: Obstetrics and Gynecology | Admitting: Obstetrics and Gynecology

## 2021-06-04 ENCOUNTER — Other Ambulatory Visit: Payer: Self-pay

## 2021-06-04 DIAGNOSIS — Z3A23 23 weeks gestation of pregnancy: Secondary | ICD-10-CM

## 2021-06-04 DIAGNOSIS — O26852 Spotting complicating pregnancy, second trimester: Secondary | ICD-10-CM

## 2021-06-04 DIAGNOSIS — N939 Abnormal uterine and vaginal bleeding, unspecified: Secondary | ICD-10-CM

## 2021-06-04 NOTE — MAU Note (Signed)
Knows she has a previa.  This morning when she used the restroom, she saw some pink.  When she used it later, there was enough that there was blood in the toilet. Called the after hours line and was told to come in. Has been kind of sore in lower abd, not cramping.

## 2021-06-04 NOTE — MAU Provider Note (Signed)
History     CSN: [id number]  Arrival date and time: 06/04/21 1408   Event Date/Time   First Provider Initiated Contact with Patient 06/04/21 1516      Chief Complaint  Patient presents with   Vaginal Bleeding   Abdominal Pain   HPI Vicki Hooper 25 y.o. [redacted]w[redacted]d Comes to MAU with 2 episodes of spotting today.  Did not bleed into her clothing.  Called her MD and she has a high risk pregnancy so told to come to MAU for evaluation.  Followed by MFM for oligo at 21 weeks with Placental abnormalities, oligohydramnios, and possible amnion separation.  Amnisure was negative since her ultrasound.  Has had bleeding only noticed initially when wiping.  No leaking of fluid, no abdominal pain and is feeling the baby move well.  Dr. Vonna Kotyk called to notify provider of patient coming in.  OB History     Gravida  3   Para      Term      Preterm      AB  2   Living         SAB  2   IAB      Ectopic      Multiple      Live Births              History reviewed. No pertinent past medical history.  Past Surgical History:  Procedure Laterality Date   MOUTH SURGERY     WRIST SURGERY Left     Family History  Problem Relation Age of Onset   Hypertension Mother    Diabetes Maternal Grandfather    Hypertension Maternal Grandfather    Diabetes Paternal Grandfather    Diabetes Sister     Social History   Tobacco Use   Smoking status: Former    Packs/day: 0.25    Years: 3.00    Pack years: 0.75    Types: Cigarettes    Quit date: 02/01/2016    Years since quitting: 5.3   Smokeless tobacco: Never  Vaping Use   Vaping Use: Never used  Substance Use Topics   Alcohol use: No    Alcohol/week: 0.0 standard drinks   Drug use: No    Allergies: No Known Allergies  Medications Prior to Admission  Medication Sig Dispense Refill Last Dose   aspirin EC 81 MG tablet Take 81 mg by mouth daily. Swallow whole.   06/04/2021   Prenatal Vit-Fe Fumarate-FA (PRENATAL MULTIVITAMIN)  TABS tablet Take 1 tablet by mouth every evening.   06/04/2021   acetaminophen (TYLENOL) 500 MG tablet Take 1,000 mg by mouth every 6 (six) hours as needed for mild pain or headache.      progesterone (PROMETRIUM) 200 MG capsule Place 200 mg vaginally daily.       Review of Systems  Constitutional:  Negative for fever.  Respiratory:  Negative for cough, shortness of breath and wheezing.   Gastrointestinal:  Negative for abdominal pain, diarrhea, nausea and vomiting.  Genitourinary:  Positive for vaginal bleeding. Negative for dysuria and vaginal discharge.  Physical Exam   Blood pressure 127/75, pulse 89, temperature 98.3 F (36.8 C), temperature source Oral, resp. rate 16, height 5\' 7"  (1.702 m), weight 74.1 kg, last menstrual period 12/08/2020, SpO2 99 %, unknown if currently breastfeeding.  Physical Exam Vitals and nursing note reviewed.  Constitutional:      Appearance: She is well-developed.  HENT:     Head: Normocephalic.  Cardiovascular:     Rate  and Rhythm: Regular rhythm.  Pulmonary:     Effort: Pulmonary effort is normal.  Abdominal:     Palpations: Abdomen is soft.     Tenderness: There is no abdominal tenderness. There is no guarding or rebound.  Genitourinary:    Comments: Speculum exam: Vagina - Small amount of clear discharge, no odor, no blood seen, no pooling Cervix - No contact bleeding, appears closed and thick Chaperone present for exam.  Musculoskeletal:        General: Normal range of motion.     Cervical back: Neck supple.  Skin:    General: Skin is warm and dry.  Neurological:     Mental Status: She is alert and oriented to person, place, and time.  Psychiatric:        Mood and Affect: Mood normal.        Behavior: Behavior normal.        Thought Content: Thought content normal.    MAU Course  Procedures  NST - FHT baseline 140 with moderate variability and 10x10 accels No decelerations No contractions Category 1 strip  MDM Courtesy call to  Dr. Vonna Kotyk and reviewed findings. Patient has scheduled MFM appt on 06-08-21 and office visit on 06-09-21.  No further imaging needed today.  Assessment and Plan  Vaginal spotting at home, resolved currently Cervix appears closed and thick  Plan Keep appointments as scheduled Return to MAU with any increased bleeding,, abdominal pain or other symptoms.  Occasional spotting may occur. Pelvic rest.  Vicki Hooper 06/04/2021, 3:23 PM

## 2021-06-07 ENCOUNTER — Encounter (HOSPITAL_COMMUNITY): Payer: Self-pay | Admitting: Obstetrics and Gynecology

## 2021-06-07 ENCOUNTER — Inpatient Hospital Stay (HOSPITAL_BASED_OUTPATIENT_CLINIC_OR_DEPARTMENT_OTHER): Payer: BC Managed Care – PPO

## 2021-06-07 ENCOUNTER — Other Ambulatory Visit: Payer: Self-pay

## 2021-06-07 ENCOUNTER — Inpatient Hospital Stay (HOSPITAL_COMMUNITY)
Admission: AD | Admit: 2021-06-07 | Discharge: 2021-06-16 | DRG: 797 | Disposition: A | Discharge status: Home / Self Care | Payer: BC Managed Care – PPO | Attending: Obstetrics and Gynecology | Admitting: Obstetrics and Gynecology

## 2021-06-07 DIAGNOSIS — Z87891 Personal history of nicotine dependence: Secondary | ICD-10-CM

## 2021-06-07 DIAGNOSIS — O4693 Antepartum hemorrhage, unspecified, third trimester: Secondary | ICD-10-CM

## 2021-06-07 DIAGNOSIS — R0602 Shortness of breath: Secondary | ICD-10-CM | POA: Diagnosis not present

## 2021-06-07 DIAGNOSIS — F419 Anxiety disorder, unspecified: Secondary | ICD-10-CM | POA: Diagnosis present

## 2021-06-07 DIAGNOSIS — Z23 Encounter for immunization: Secondary | ICD-10-CM

## 2021-06-07 DIAGNOSIS — Z3A24 24 weeks gestation of pregnancy: Secondary | ICD-10-CM

## 2021-06-07 DIAGNOSIS — O99344 Other mental disorders complicating childbirth: Secondary | ICD-10-CM | POA: Diagnosis not present

## 2021-06-07 DIAGNOSIS — O4102X0 Oligohydramnios, second trimester, not applicable or unspecified: Secondary | ICD-10-CM | POA: Diagnosis not present

## 2021-06-07 DIAGNOSIS — Z20822 Contact with and (suspected) exposure to covid-19: Secondary | ICD-10-CM | POA: Diagnosis present

## 2021-06-07 DIAGNOSIS — O99892 Other specified diseases and conditions complicating childbirth: Secondary | ICD-10-CM | POA: Diagnosis present

## 2021-06-07 DIAGNOSIS — O42919 Preterm premature rupture of membranes, unspecified as to length of time between rupture and onset of labor, unspecified trimester: Secondary | ICD-10-CM | POA: Diagnosis present

## 2021-06-07 DIAGNOSIS — R0789 Other chest pain: Secondary | ICD-10-CM | POA: Diagnosis not present

## 2021-06-07 DIAGNOSIS — O4692 Antepartum hemorrhage, unspecified, second trimester: Secondary | ICD-10-CM | POA: Diagnosis not present

## 2021-06-07 DIAGNOSIS — O42112 Preterm premature rupture of membranes, onset of labor more than 24 hours following rupture, second trimester: Secondary | ICD-10-CM

## 2021-06-07 DIAGNOSIS — Z362 Encounter for other antenatal screening follow-up: Secondary | ICD-10-CM | POA: Diagnosis not present

## 2021-06-07 DIAGNOSIS — O42912 Preterm premature rupture of membranes, unspecified as to length of time between rupture and onset of labor, second trimester: Secondary | ICD-10-CM | POA: Diagnosis present

## 2021-06-07 LAB — CBC
HCT: 39.4 % (ref 36.0–46.0)
Hemoglobin: 13.1 g/dL (ref 12.0–15.0)
MCH: 32.6 pg (ref 26.0–34.0)
MCHC: 33.2 g/dL (ref 30.0–36.0)
MCV: 98 fL (ref 80.0–100.0)
Platelets: 214 10*3/uL (ref 150–400)
RBC: 4.02 MIL/uL (ref 3.87–5.11)
RDW: 12.9 % (ref 11.5–15.5)
WBC: 14.7 10*3/uL — ABNORMAL HIGH (ref 4.0–10.5)
nRBC: 0 % (ref 0.0–0.2)

## 2021-06-07 LAB — URINALYSIS, ROUTINE W REFLEX MICROSCOPIC
Bacteria, UA: NONE SEEN
Bilirubin Urine: NEGATIVE
Glucose, UA: NEGATIVE mg/dL
Ketones, ur: NEGATIVE mg/dL
Leukocytes,Ua: NEGATIVE
Nitrite: NEGATIVE
Protein, ur: NEGATIVE mg/dL
Specific Gravity, Urine: 1.003 — ABNORMAL LOW (ref 1.005–1.030)
pH: 7 (ref 5.0–8.0)

## 2021-06-07 LAB — TYPE AND SCREEN
ABO/RH(D): O POS
Antibody Screen: NEGATIVE

## 2021-06-07 LAB — RESP PANEL BY RT-PCR (FLU A&B, COVID) ARPGX2
Influenza A by PCR: NEGATIVE
Influenza B by PCR: NEGATIVE
SARS Coronavirus 2 by RT PCR: NEGATIVE

## 2021-06-07 LAB — AMNISURE RUPTURE OF MEMBRANE (ROM) NOT AT ARMC: Amnisure ROM: POSITIVE

## 2021-06-07 MED ORDER — ACETAMINOPHEN 325 MG PO TABS
650.0000 mg | ORAL_TABLET | ORAL | Status: DC | PRN
  Administered 2021-06-11 – 2021-06-15 (×7): 650 mg via ORAL
  Filled 2021-06-07 (×7): qty 2

## 2021-06-07 MED ORDER — SODIUM CHLORIDE 0.9 % IV SOLN
2.0000 g | Freq: Four times a day (QID) | INTRAVENOUS | Status: AC
  Administered 2021-06-07 – 2021-06-08 (×6): 2 g via INTRAVENOUS
  Filled 2021-06-07 (×8): qty 2000

## 2021-06-07 MED ORDER — DOCUSATE SODIUM 100 MG PO CAPS
100.0000 mg | ORAL_CAPSULE | Freq: Every day | ORAL | Status: DC
  Administered 2021-06-07 – 2021-06-14 (×8): 100 mg via ORAL
  Filled 2021-06-07 (×8): qty 1

## 2021-06-07 MED ORDER — AMOXICILLIN 500 MG PO CAPS
500.0000 mg | ORAL_CAPSULE | Freq: Three times a day (TID) | ORAL | Status: DC
  Administered 2021-06-09 – 2021-06-11 (×8): 500 mg via ORAL
  Filled 2021-06-07 (×8): qty 1

## 2021-06-07 MED ORDER — ZOLPIDEM TARTRATE 5 MG PO TABS: 5.0000 mg | ORAL_TABLET | Freq: Every evening | ORAL | Status: DC | PRN

## 2021-06-07 MED ORDER — AMOXICILLIN 500 MG PO CAPS: 500.0000 mg | ORAL_CAPSULE | Freq: Three times a day (TID) | ORAL | Status: DC

## 2021-06-07 MED ORDER — PRENATAL MULTIVITAMIN CH
1.0000 | ORAL_TABLET | Freq: Every day | ORAL | Status: DC
  Administered 2021-06-07 – 2021-06-14 (×8): 1 via ORAL
  Filled 2021-06-07 (×8): qty 1

## 2021-06-07 MED ORDER — CALCIUM CARBONATE ANTACID 500 MG PO CHEW
2.0000 | CHEWABLE_TABLET | ORAL | Status: DC | PRN
  Administered 2021-06-11: 400 mg via ORAL
  Filled 2021-06-07: qty 2

## 2021-06-07 MED ORDER — LACTATED RINGERS IV SOLN: INTRAVENOUS | Status: DC

## 2021-06-07 MED ORDER — BETAMETHASONE SOD PHOS & ACET 6 (3-3) MG/ML IJ SUSP
12.0000 mg | INTRAMUSCULAR | Status: AC
  Administered 2021-06-07 – 2021-06-08 (×2): 12 mg via INTRAMUSCULAR
  Filled 2021-06-07: qty 5

## 2021-06-07 MED ORDER — AZITHROMYCIN 250 MG PO TABS
1000.0000 mg | ORAL_TABLET | Freq: Once | ORAL | Status: AC
  Administered 2021-06-07: 1000 mg via ORAL
  Filled 2021-06-07: qty 4

## 2021-06-07 MED ORDER — SODIUM CHLORIDE 0.9 % IV SOLN: 2.0000 g | Freq: Four times a day (QID) | INTRAVENOUS | Status: DC

## 2021-06-07 NOTE — H&P (Signed)
Vicki Hooper is a 25 y.o. female G3P0020 at 42 0/7 weeks (EDD 09/27/21 by 7 week [place] inconsistent with LMP) presented to MAU with c/o increased intermittent pink tinged fluid, mostly at night.  She had a positive amniosure in MAU early this AM. Prenatal care had been complicated by oligohydramnios first noted at 21 weeks and abnormal placentation with placental lakes and low-lying vs previa. Growth has been normal and was scheduled for f/u US with MFM tomorrow. She had been on vaginal progesterone in the first trimester and baby ASA for h/o SAB x 2.  NIPT normal  OB History     Gravida  3   Para      Term      Preterm      AB  2   Living         SAB  2   IAB      Ectopic      Multiple      Live Births            SAB x 2  History reviewed. No pertinent past medical history. Past Surgical History:  Procedure Laterality Date   MOUTH SURGERY     WRIST SURGERY Left    Family History: family history includes Diabetes in her maternal grandfather, paternal grandfather, and sister; Hypertension in her maternal grandfather and mother. Social History:  reports that she quit smoking about 5 years ago. Her smoking use included cigarettes. She has a 0.75 pack-year smoking history. She has never used smokeless tobacco. She reports that she does not drink alcohol and does not use drugs.     Maternal Diabetes: not yet tested Genetic Screening: Normal Maternal Ultrasounds/Referrals: Other: Fetal Ultrasounds or other Referrals:  Referred to Materal Fetal Medicine , Other:  Maternal Substance Abuse:  No Significant Maternal Medications:  Meds include: Other:  Significant Maternal Lab Results:  None Other Comments:  None  Review of Systems  Gastrointestinal:  Negative for abdominal pain.  Genitourinary:  Positive for vaginal discharge.  Maternal Medical History:  Reason for admission: Rupture of membranes.   Contractions: Onset was 2 days ago.   Fetal activity: Perceived  fetal activity is normal.   Prenatal complications: oligohydramnios Prenatal Complications - Diabetes: none.    Blood pressure 118/70, pulse 83, temperature 98.2 F (36.8 C), resp. rate 18, height 5\' 7"  (1.702 m), weight 74.8 kg, last menstrual period 12/08/2020, unknown if currently breastfeeding. Maternal Exam:  Uterine Assessment: Contraction strength is mild.  Contraction frequency is rare.  Abdomen: Patient reports no abdominal tenderness. Introitus: Normal vulva. Normal vagina.   Physical Exam Constitutional:      Appearance: Normal appearance.  Cardiovascular:     Rate and Rhythm: Normal rate and regular rhythm.  Pulmonary:     Effort: Pulmonary effort is normal.  Abdominal:     Palpations: Abdomen is soft.  Genitourinary:    General: Normal vulva.  Neurological:     Mental Status: She is alert.  Psychiatric:        Mood and Affect: Mood normal.    Prenatal labs: ABO, Rh: --/--/O POS (07/06 0409) Antibody: NEG (07/06 0409) Rubella:  Immune RPR:   NR HBsAg:   Neg HIV:   NR GBS:   unknown  Assessment/Plan: Long d/w pt regarding implications of probable PROM and risk of infection, abruption, fetal distress  or labor requiring delivery and need for c-section given LLP.  Pt and partner want everything done for baby and had meeting with  NICU yesterday prior to documented PROM. Betamethasone given with second dose pending Fetal monitoring q shift, reports good FM Will move up MFM consult from tomorrow to today given change of status  Vicki Hooper 06/07/2021, 6:53 AM

## 2021-06-07 NOTE — MAU Note (Signed)
Baby moving and pt aware of FM

## 2021-06-07 NOTE — MAU Note (Addendum)
Sunday morning I had alittle spotting and came in to MAU and everything was ok. For last 3 nights I have leaked pink fld that makes a pad pretty wet. My amniotic fld is already low so just wanted to be sure everything is ok. Have periodic "gushes" of fld but mostly at night. No pain. I have felt baby move today but not as much as usual

## 2021-06-07 NOTE — Progress Notes (Signed)
Patient endorses +FM, denies VB, CTX. Thin clear discharge, denies purulence or odor. Currently on CEFM. Tolerating PO, denies fevers. Cihlls BP 108/61 (BP Location: Left Arm)   Pulse 79   Temp 98 F (36.7 C) (Oral)   Resp 18   Ht 5\' 7"  (1.702 m)   Wt 74.8 kg   LMP 12/08/2020   SpO2 100%   BMI 25.84 kg/m  Gen: NAD Abd: gravid, NTTP GU deferred  Reviewed type of section given EGA if it were to occur including LTCS vs classical and how latter would mean repeat sections at 37wks in future pregnancies. Is s/p MFM consult, note pending. MFM scan done today, prelim shows overall normal anatomy but final report pending.  Spirits overall good, pt to let 02/05/2021 know of any changes

## 2021-06-07 NOTE — MAU Note (Signed)
Donna RN CN in NICU notified of pt's admission and status.  

## 2021-06-07 NOTE — Progress Notes (Signed)
EFM d/c for now and pt up to BR

## 2021-06-07 NOTE — Plan of Care (Signed)
  Problem: Education: Goal: Knowledge of disease or condition will improve Outcome: Progressing Goal: Knowledge of the prescribed therapeutic regimen will improve Outcome: Progressing Goal: Individualized Educational Video(s) Outcome: Progressing   Problem: Clinical Measurements: Goal: Complications related to the disease process, condition or treatment will be avoided or minimized Outcome: Progressing   

## 2021-06-08 ENCOUNTER — Ambulatory Visit: Payer: BC Managed Care – PPO

## 2021-06-08 NOTE — Progress Notes (Signed)
HD #2, [redacted]W[redacted]D, Oligo, PPROM, LLP Feeling ok, no ctx but occasional abdominal discomfort, still leaks some mostly at night-a bit more bloody, +FM Afeb, VSS Fundus NT  U/s report still pending  Will continue observation, abx for latency, monitor for signs of labor or infection

## 2021-06-08 NOTE — Consult Note (Signed)
MFM Note  Vicki Hooper was seen in consultation due to PPROM. She is a G3P0020 currently at 24 weeks and 0 days.  The patient reports that she has been leaking pink-tinged fluid.  Her amniosure test performed in the MAU was positive.  The patient had an ultrasound performed 2 weeks ago that showed oligohydramnios with an anterior placenta previa.  The cause of oligohydramnios noted earlier in her pregnancy remains undetermined.   She denies any significant past medical history.   The patient had an ultrasound performed this afternoon that showed an overall EFW of 1 pound 11 ounces (762 g, 86 percentile).  Oligohydramnios is noted with a single maximal vertical pocket of amniotic fluid measuring 1.5 cm. The fetus was in the vertex presentation.  An anterior placenta previa continues to be suspected on a transabdominal scan.  The views of the fetal anatomy were limited today due to oligohydramnios.   Due to PPROM, the patient is receiving latency antibiotics and a complete course of antenatal corticosteroids.   The usual management and implications of PPROM were discussed with the patient.  She was advised that due to rupture of membranes, she will require inpatient management until delivery with daily fetal testing.  She should receive a complete course of antenatal corticosteroids and complete a course of latency antibiotics.    She was advised that due to PPROM, delivery will be recommended at around 34 weeks.  Delivery prior to this time will be indicated should she go into spontaneous labor, should she show any signs of an intrauterine infection, or at any time for nonreassuring fetal status.  The goal is to delay delivery as long as there is reassuring maternal and fetal status until she reaches a more optimal gestational age.  The patient understands that she will require a cesarean delivery should the placenta previa persist.  I will perform a transvaginal ultrasound tomorrow to confirm if  a placenta previa is still present.    Should she require delivery before 32 weeks, magnesium sulfate should be given for fetal neuroprotection.  Should she be at risk for delivery, and it has been 7 days or greater since she received the initial course of antenatal corticosteroids, a rescue course of steroids should be given.   She understands that her baby will require a NICU admission following delivery.   At the end of the consultation, the patient stated that all her questions had been answered to her complete satisfaction.    Thank you for referring this patient for a Maternal-Fetal Medicine consultation.   Recommendations: -Inpatient management until delivery -Daily fetal testing -Weekly biophysical profiles with fluid checks -Latency antibiotics -Magnesium sulfate for fetal neuroprotection should she require delivery before 32 weeks -Rescue course of steroids as indicated -Delivery at around 34 weeks or earlier should she go into spontaneous labor, should she show any signs of an intrauterine infection, or at any time for nonreassuring fetal status -Transvaginal ultrasound tomorrow to assess placental location

## 2021-06-09 NOTE — Progress Notes (Signed)
Patient ID: Vicki Hooper, female   DOB: 1996-04-17, 25 y.o.   MRN: [id number] HD #3 PPROM, oligo, LLP  Pt reports some increased LOF, now throughout the day.  Some pink blood, but nothing heavy No major contractions or pain +FM  Gravid NT  On latency abx S/p betamethasone x 2 Continue monitoring q shift.

## 2021-06-10 LAB — CBC
HCT: 32.9 % — ABNORMAL LOW (ref 36.0–46.0)
HCT: 34.6 % — ABNORMAL LOW (ref 36.0–46.0)
Hemoglobin: 10.8 g/dL — ABNORMAL LOW (ref 12.0–15.0)
Hemoglobin: 11.3 g/dL — ABNORMAL LOW (ref 12.0–15.0)
MCH: 32.1 pg (ref 26.0–34.0)
MCH: 32 pg (ref 26.0–34.0)
MCHC: 32.8 g/dL (ref 30.0–36.0)
MCHC: 32.7 g/dL (ref 30.0–36.0)
MCV: 97.9 fL (ref 80.0–100.0)
MCV: 98 fL (ref 80.0–100.0)
Platelets: 189 10*3/uL (ref 150–400)
Platelets: 209 10*3/uL (ref 150–400)
RBC: 3.36 MIL/uL — ABNORMAL LOW (ref 3.87–5.11)
RBC: 3.53 MIL/uL — ABNORMAL LOW (ref 3.87–5.11)
RDW: 13 % (ref 11.5–15.5)
RDW: 12.9 % (ref 11.5–15.5)
WBC: 12 10*3/uL — ABNORMAL HIGH (ref 4.0–10.5)
WBC: 17.4 10*3/uL — ABNORMAL HIGH (ref 4.0–10.5)
nRBC: 0 % (ref 0.0–0.2)
nRBC: 0 % (ref 0.0–0.2)

## 2021-06-10 MED ORDER — HYDROXYZINE HCL 50 MG PO TABS: 25.0000 mg | ORAL_TABLET | Freq: Four times a day (QID) | ORAL | Status: DC | PRN

## 2021-06-10 MED ORDER — SERTRALINE HCL 50 MG PO TABS
25.0000 mg | ORAL_TABLET | Freq: Every day | ORAL | Status: AC
  Administered 2021-06-10 – 2021-06-12 (×3): 25 mg via ORAL
  Filled 2021-06-10 (×3): qty 1

## 2021-06-10 MED ORDER — SERTRALINE HCL 50 MG PO TABS
50.0000 mg | ORAL_TABLET | Freq: Every day | ORAL | Status: DC
  Administered 2021-06-13 – 2021-06-15 (×3): 50 mg via ORAL
  Filled 2021-06-10 (×4): qty 1

## 2021-06-10 MED ORDER — LACTATED RINGERS IV BOLUS
500.0000 mL | Freq: Once | INTRAVENOUS | Status: AC
  Administered 2021-06-10: 500 mL via INTRAVENOUS

## 2021-06-10 MED ORDER — LACTATED RINGERS IV SOLN: INTRAVENOUS | Status: DC

## 2021-06-10 MED ORDER — NIFEDIPINE 10 MG PO CAPS
20.0000 mg | ORAL_CAPSULE | Freq: Once | ORAL | Status: AC
  Administered 2021-06-10: 20 mg via ORAL
  Filled 2021-06-10: qty 2

## 2021-06-10 NOTE — Progress Notes (Signed)
Patient ID: Vicki Hooper, female   DOB: [birth year], 25 y.o.   MRN: [id number] CTSP for c/o cramping she ranks a 4/10  Pt states just having low pelvic cramping, still having some light bleeding intermittently about 1/3 of a pad q few hours.  FHR normal for gestational age with +variability and only rare variable deceleration  Will give IV fluid bolus of and procardia If cramping and light bleeding persist in an hour d/w pt will start magnesium for tocolytic effect and neuroprotection.  Type and screen updated with CBC tonight and ordered for q 3 days.

## 2021-06-10 NOTE — Progress Notes (Signed)
Patient ID: Vicki Hooper, female   DOB: 29-Dec-1995, 25 y.o.   MRN: [id number] CTSP for vaginal bleeding and reported increased anxiety  Pt got up to restroom and had epsiode of BRB onto a pad coating about 1/3 of it and dripping in the toilet a bit.  She is having no pain or contractions and baby is moving well. Pt also reported to RN she is having a lot of anxiety. She tells me she has struggled with anxiety much of her life but has never treated it, just ignored it. Currently she reports some depression and anxiety that are just worse being in hospital and with all going on with the pregnancy.  We discussed options and she is open to trying zoloft with hydroxyzine prn.  We also discussed that she should continue to talk through her feelings and let family support her.    Will place on continuous monitoring and follow bleeding, none active right now and FHR with good variability and no decelerations

## 2021-06-10 NOTE — Progress Notes (Signed)
Upon assessment, pt was tearful. Pt expressed anxiety and wishes to speak with provider. Dr. Senaida Ores notified [phone].

## 2021-06-10 NOTE — Progress Notes (Signed)
Patient ID: Vicki Hooper, female   DOB: Apr 16, 1996, 25 y.o.   MRN: [id number] HD #4 PPROM, oligo, LLP  Pt stable, not as much leakage and no VB Baby active, no pain  Afeb VSS FHR overall with good variability and no decelerations on q shift monitoring, good for gestational age  S/p betamethasone 06/07/21 and 06/08/21 On latency abx Continue current care.  Will allow wheelchair ride outside today

## 2021-06-10 NOTE — Progress Notes (Signed)
1145 AM: Pt displays signs of anxiety r/t hospital confinement. Emotional, physical, and environmental support provided.

## 2021-06-10 NOTE — Progress Notes (Signed)
Upon assessment small amount of bright red blood visualized on peripad as well as blood in toilet. Senaida Ores, MD made aware, Fetal monitors placed.

## 2021-06-11 ENCOUNTER — Inpatient Hospital Stay (HOSPITAL_BASED_OUTPATIENT_CLINIC_OR_DEPARTMENT_OTHER): Payer: BC Managed Care – PPO

## 2021-06-11 DIAGNOSIS — O42912 Preterm premature rupture of membranes, unspecified as to length of time between rupture and onset of labor, second trimester: Secondary | ICD-10-CM | POA: Diagnosis not present

## 2021-06-11 DIAGNOSIS — O4102X0 Oligohydramnios, second trimester, not applicable or unspecified: Secondary | ICD-10-CM

## 2021-06-11 DIAGNOSIS — Z3A24 24 weeks gestation of pregnancy: Secondary | ICD-10-CM | POA: Diagnosis not present

## 2021-06-11 DIAGNOSIS — Z362 Encounter for other antenatal screening follow-up: Secondary | ICD-10-CM | POA: Diagnosis not present

## 2021-06-11 DIAGNOSIS — O4692 Antepartum hemorrhage, unspecified, second trimester: Secondary | ICD-10-CM | POA: Diagnosis not present

## 2021-06-11 DIAGNOSIS — O4402 Placenta previa specified as without hemorrhage, second trimester: Secondary | ICD-10-CM

## 2021-06-11 LAB — COMPREHENSIVE METABOLIC PANEL
ALT: 38 U/L (ref 0–44)
AST: 18 U/L (ref 15–41)
Albumin: 3.1 g/dL — ABNORMAL LOW (ref 3.5–5.0)
Alkaline Phosphatase: 67 U/L (ref 38–126)
Anion gap: 8 (ref 5–15)
BUN: <5 mg/dL — ABNORMAL LOW (ref 6–20)
CO2: 27 mmol/L (ref 22–32)
Calcium: 7.6 mg/dL — ABNORMAL LOW (ref 8.9–10.3)
Chloride: 101 mmol/L (ref 98–111)
Creatinine, Ser: 0.48 mg/dL (ref 0.44–1.00)
GFR, Estimated: >60 mL/min (ref >60)
Glucose, Bld: 109 mg/dL — ABNORMAL HIGH (ref 70–99)
Potassium: 3.3 mmol/L — ABNORMAL LOW (ref 3.5–5.1)
Sodium: 136 mmol/L (ref 135–145)
Total Bilirubin: 0.8 mg/dL (ref 0.3–1.2)
Total Protein: 6.2 g/dL — ABNORMAL LOW (ref 6.5–8.1)

## 2021-06-11 LAB — TYPE AND SCREEN
ABO/RH(D): O POS
Antibody Screen: NEGATIVE

## 2021-06-11 LAB — CBC WITH DIFFERENTIAL/PLATELET
Abs Immature Granulocytes: 0.33 10*3/uL — ABNORMAL HIGH (ref 0.00–0.07)
Basophils Absolute: 0.1 10*3/uL (ref 0.0–0.1)
Basophils Relative: 0 %
Eosinophils Absolute: 0.1 10*3/uL (ref 0.0–0.5)
Eosinophils Relative: 0 %
HCT: 37.2 % (ref 36.0–46.0)
Hemoglobin: 12.2 g/dL (ref 12.0–15.0)
Immature Granulocytes: 1 %
Lymphocytes Relative: 5 %
Lymphs Abs: 1.2 10*3/uL (ref 0.7–4.0)
MCH: 31.9 pg (ref 26.0–34.0)
MCHC: 32.8 g/dL (ref 30.0–36.0)
MCV: 97.1 fL (ref 80.0–100.0)
Monocytes Absolute: 1.1 10*3/uL — ABNORMAL HIGH (ref 0.1–1.0)
Monocytes Relative: 4 %
Neutro Abs: 22.3 10*3/uL — ABNORMAL HIGH (ref 1.7–7.7)
Neutrophils Relative %: 90 %
Platelets: 220 10*3/uL (ref 150–400)
RBC: 3.83 MIL/uL — ABNORMAL LOW (ref 3.87–5.11)
RDW: 12.8 % (ref 11.5–15.5)
WBC: 25.1 10*3/uL — ABNORMAL HIGH (ref 4.0–10.5)
nRBC: 0 % (ref 0.0–0.2)

## 2021-06-11 LAB — MAGNESIUM: Magnesium: 3.8 mg/dL — ABNORMAL HIGH (ref 1.7–2.4)

## 2021-06-11 MED ORDER — MAGNESIUM SULFATE BOLUS VIA INFUSION
4.0000 g | Freq: Once | INTRAVENOUS | Status: AC
  Administered 2021-06-11: 4 g via INTRAVENOUS
  Filled 2021-06-11: qty 1000

## 2021-06-11 MED ORDER — SODIUM CHLORIDE 0.9 % IV SOLN
250.0000 mg | Freq: Four times a day (QID) | INTRAVENOUS | Status: DC
  Administered 2021-06-11 – 2021-06-13 (×7): 250 mg via INTRAVENOUS
  Filled 2021-06-11 (×9): qty 5

## 2021-06-11 MED ORDER — PANTOPRAZOLE SODIUM 40 MG IV SOLR
40.0000 mg | Freq: Every day | INTRAVENOUS | Status: DC
  Administered 2021-06-11 – 2021-06-14 (×4): 40 mg via INTRAVENOUS
  Filled 2021-06-11 (×5): qty 40

## 2021-06-11 MED ORDER — LACTATED RINGERS IV BOLUS
500.0000 mL | Freq: Once | INTRAVENOUS | Status: AC
  Administered 2021-06-11: 500 mL via INTRAVENOUS

## 2021-06-11 MED ORDER — ONDANSETRON HCL 4 MG/2ML IJ SOLN
4.0000 mg | Freq: Four times a day (QID) | INTRAMUSCULAR | Status: DC | PRN
  Administered 2021-06-11 – 2021-06-15 (×3): 4 mg via INTRAVENOUS
  Filled 2021-06-11 (×3): qty 2

## 2021-06-11 MED ORDER — MAGNESIUM SULFATE 40 GM/1000ML IV SOLN
2.0000 g/h | INTRAVENOUS | Status: DC
  Administered 2021-06-11: 2 g/h via INTRAVENOUS
  Filled 2021-06-11: qty 1000

## 2021-06-11 MED ORDER — ACETAMINOPHEN 500 MG PO TABS
1000.0000 mg | ORAL_TABLET | Freq: Once | ORAL | Status: AC
  Administered 2021-06-11: 1000 mg via ORAL
  Filled 2021-06-11: qty 2

## 2021-06-11 MED ORDER — SODIUM CHLORIDE 0.9 % IV SOLN
2.0000 g | Freq: Four times a day (QID) | INTRAVENOUS | Status: DC
  Administered 2021-06-11 – 2021-06-13 (×7): 2 g via INTRAVENOUS
  Filled 2021-06-11 (×7): qty 2000

## 2021-06-11 MED ORDER — MAGNESIUM SULFATE 40 GM/1000ML IV SOLN
2.0000 g/h | INTRAVENOUS | Status: DC
Start: 2021-06-11 — End: 2021-06-12
  Administered 2021-06-11: 2 g/h via INTRAVENOUS
  Filled 2021-06-11: qty 1000

## 2021-06-11 MED ORDER — LACTATED RINGERS IV SOLN: INTRAVENOUS | Status: DC

## 2021-06-11 MED ORDER — MAGNESIUM HYDROXIDE 400 MG/5ML PO SUSP
30.0000 mL | Freq: Every day | ORAL | Status: DC | PRN
  Administered 2021-06-12: 30 mL via ORAL
  Filled 2021-06-11: qty 30

## 2021-06-11 NOTE — Progress Notes (Addendum)
Patient ID: Vicki Hooper, female   DOB: 02/06/1996, 25 y.o.   MRN: [id number] HD # 5 PPROM, oligo, LLP  Pt had increased cramping overnight and was started on magnesium for neuroprotection given possibility is moving towards delivery.  She has continued to have light bleeding, mostly when gets up to bathroom has thicker red blood at end of stream.  C/o some increased abdominal tenderness and occasional sharp pains but tylenol did help.  No BM x 2 days  Afeb VSS  Gravid, slightly tender to deep palpation  FHR baseline 120-130 with variability and accels  CBC last PM hgb 11.3 (prior 10.8) and WBC 17.4 (increased from 12)  Pt with some increased VB over last 24 hours and WBC increased, no fever On magnesium for neuroprotection until determine if delivery eminent, but if remains stable will d/c FHR currently looks good for gestational age D/w Dr. Judeth Cornfield and he agrees nothing definitive as of yet to indicate delivery, but will rescan her today and see if can assess placenta fully given the increased bleeding D/w neonatologist and they will come visit her and update consult today

## 2021-06-11 NOTE — Progress Notes (Signed)
Patient ID: Vicki Hooper, female   DOB: Dec 20, 1995, 25 y.o.   MRN: [id number] Nursing calls to report patient has begun cramping again, some irritability on the monitor but not frank contractions.  Still afebrile FHR 140-150 --no significant decelerations.  Still light bleeding intermittently but nothing heavy  Will restart magnesium for neuroprotection and if cramping continues move to L&D. Already restarted on IV antibiotics. Contiuous FHR monitoring

## 2021-06-11 NOTE — Progress Notes (Signed)
Patient ID: Vicki Hooper, female   DOB: 1996/04/23, 25 y.o.   MRN: [id number] Pt had episode of chest tightness and felt mildly SOB so magnesium was not restarted on return to room from [place].  Labs done and WBC increasing, but still no temperature and FHR 130-140 with no decels and + variability  VB unchanged, just light VB mostly when goes to restroom  MFM input appreciated  TVUS shows no previa and no abruption, baby vertex.  Cervix 1.2cm long  Pt understands that this means she can labor if baby tolerates and no heavy bleeding or fetal distress. I am not sure what her light bleeding is coming from but will continue to monitor.   FHR thus far normal for 24 weeks.  Pt has converted to oral ampicillin for latency antibiotics but given increasing WBC count I am going to put her back on IV antibiotics for possible brewing chorioamnionitis.  If develops temperature or begins contractions will proceed with delivery.  Will leave magnesium off for now and restart for neuroprotection if begins to labor.

## 2021-06-11 NOTE — Consult Note (Signed)
Maternal-Fetal Medicine (Follow-up Consultation)  Patient was admitted 4 days ago with complaints of vaginal bleeding and leakage of amniotic fluid.  On clinical examination and ultrasound, PPROM was confirmed.  On transabdominal scan, there was an appearance of placenta previa.  Since admission, the patient received antenatal corticosteroids.  Patient is on antibiotic prophylaxis and is on amoxicillin.  She complained of increased uterine contractions and is now receiving magnesium sulfate infusion for tocolysis.  On ultrasound performed 3 days ago, oligohydramnios was seen.  On transabdominal scan anterior previa was suspected.  The estimated fetal weight was 762 g.  Cephalic presentation. Patient will be having neonatology consultation today.  P/E: Patient is comfortable on the ultrasound table; not in distress. Abdomen: Soft gravid uterus; no tenderness. Vitals (from her chart): BP 132/75 mmHg, pulse 72/min, temperature 98.2, RR 18/minute.  Labs: WBC 17.4, hemoglobin 11.3, hematocrit 34.6, platelets 209. Previous WBC about 24 hours ago was 12.  Ultrasound (see ultrasound report for details) A limited ultrasound study was performed.  Oligohydramnios was seen.  Cephalic presentation.  Placenta is anterior but the inferior edge could not be ascertained on transabdominal scan. I explained that transvaginal ultrasound is usually not performed after diagnosis of P PROM.  However the placental position is important and if she does not have previa, cesarean section can be avoided.  Patient agreed to have vaginal ultrasound. After explaining, we performed translabial ultrasound that could not provide enough information. We performed transvaginal ultrasound.  The cervix measures 1.2 cm.  Cephalic presentation.  The placental edge was 4 away from the internal os. There is no evidence of placenta previa or low-lying placenta.   I reassured the patient of the findings.  -I counseled her on preterm  labor, tocolysis and the possibility of delivery. -We will repeat WBC in 24 hours.  She does not have fever and her temperature is normal. -I counseled the patient that if there is evidence of intra amniotic infection, we will proceed with delivery. -I reassured her that vaginal delivery is safe and cesarean section need not be performed unless there is heavy vaginal bleeding or for other obstetric indications.  Recommendations: -Continue prophylactic antibiotics. -Magnesium sulfate may be discontinued if contractions sees. -Neonatal consultation. -Vaginal delivery if patient goes into labor and cephalic presentation persists.  Thank you for consultation.  Consultation including face-to-face counseling (more than 50%) is 40 minutes.

## 2021-06-11 NOTE — Consult Note (Signed)
Neonatology Consult Note:  At the request of the patients obstetrician Dr. Marvel Plan I met with Berdie Ogren and FOB.  She is a 25 y.o. female G96P0020 at 13 4/7 weeks (EDD 09/27/21 by 7 week [place] inconsistent with LMP)   Prenatal care has been complicated by oligohydramnios first noted at 21 weeks and abnormal placentation with placental lakes and low-lying vs previa. Growth has been normal.  She has been on vaginal progesterone in the first trimester and baby ASA for h/o SAB x 2.  NIPT normal.  Now s/p Betamethasone.  Back on IV antibiotics for possible brewing chorioamnionitis.  She received magnesium for neuroprotection with a plan to resume for neuroprotection if she begins to labor.    We discussed morbidity/mortality at this gestional age, delivery room resuscitation, including intubation and surfactant in DR.  Discussed mechanical ventilation and risk for chronic lung disease, risk for IVH with potential for motor / cognitive deficits, ROP, NEC, sepsis, as well as temperature instability and feeding immaturity.  Discussed NG / OG feeds, benefits of MBM in reducing incidence of NEC.   Discussed likely length of stay.  Thank you for allowing [place] to participate in her care.  Please call with questions.  Higinio Roger, DO  Neonatologist  The total length of face-to-face or floor / unit time for this encounter was 40 minutes.  Counseling and / or coordination of care was greater than fifty percent of the time.

## 2021-06-12 LAB — CBC
HCT: 33.1 % — ABNORMAL LOW (ref 36.0–46.0)
Hemoglobin: 10.9 g/dL — ABNORMAL LOW (ref 12.0–15.0)
MCH: 32 pg (ref 26.0–34.0)
MCHC: 32.9 g/dL (ref 30.0–36.0)
MCV: 97.1 fL (ref 80.0–100.0)
Platelets: 205 10*3/uL (ref 150–400)
RBC: 3.41 MIL/uL — ABNORMAL LOW (ref 3.87–5.11)
RDW: 13.1 % (ref 11.5–15.5)
WBC: 22.5 10*3/uL — ABNORMAL HIGH (ref 4.0–10.5)
nRBC: 0 % (ref 0.0–0.2)

## 2021-06-12 NOTE — Progress Notes (Signed)
HD#6 [redacted]W[redacted]D, PPROM, oligo  Feeling a little better, cramping has improved, bleeding has improved, +FM Afeb, VSS Fundus soft  FHT-130-140, mod variability, ? Some variable decels  WBC down to 22k, Hgb 10.9  Will d/c magnesium since delivery currently does not appear imminent.  Will continue IV antibiotics for now, monitor for signs of labor or infection

## 2021-06-13 MED ORDER — AMOXICILLIN 500 MG PO CAPS
500.0000 mg | ORAL_CAPSULE | Freq: Three times a day (TID) | ORAL | Status: DC
  Administered 2021-06-13 – 2021-06-14 (×6): 500 mg via ORAL
  Filled 2021-06-13 (×7): qty 1

## 2021-06-13 MED ORDER — ERYTHROMYCIN BASE 250 MG PO TABS
250.0000 mg | ORAL_TABLET | Freq: Three times a day (TID) | ORAL | Status: DC
  Administered 2021-06-13 – 2021-06-14 (×6): 250 mg via ORAL
  Filled 2021-06-13 (×7): qty 1

## 2021-06-13 MED ORDER — ERYTHROMYCIN BASE 250 MG PO TABS
500.0000 mg | ORAL_TABLET | Freq: Three times a day (TID) | ORAL | Status: DC
  Filled 2021-06-13: qty 2

## 2021-06-13 MED ORDER — ERYTHROMYCIN BASE 250 MG PO TABS
250.0000 mg | ORAL_TABLET | Freq: Three times a day (TID) | ORAL | Status: DC
  Filled 2021-06-13: qty 1

## 2021-06-13 MED ORDER — AMOXICILLIN 250 MG PO CAPS
250.0000 mg | ORAL_CAPSULE | Freq: Three times a day (TID) | ORAL | Status: DC
  Filled 2021-06-13: qty 1

## 2021-06-13 NOTE — Progress Notes (Signed)
Called to patient bedside regarding tracing and symptoms. During shift monitoring, noted to have prolonged decel, approx 4m. While I was in room, min to mod variability, broken tracing 2/2 fetal movement but no further decels appreciated. Will keep on continuous monitoring at this time Additionally, patient notes some irregular cramping but feels more rectal pressure. CE done, 1/70/-1. Upon chart review, last CL was 1.57cm on 7/10, >3cm on admission. Thin dark blood mixed with likely discharge on exam, no bright red bleeding at this time. Will recheck at 2000. If further cervical progress OR increased VB, move to L&D, MgSO4. Contacted NICU to update patient's status, appreciate consults. Would have room for patient if she were to deliver. BP 123/70 (BP Location: Left Arm)   Pulse 61   Temp 98 F (36.7 C) (Oral)   Resp 17   Ht 5\' 7"  (1.702 m)   Wt 74.8 kg   LMP 12/08/2020   SpO2 98%   BMI 25.84 kg/m  Close eye on status

## 2021-06-13 NOTE — Progress Notes (Signed)
AP PROGRESS NOTE  S: Patient endorses +FM, denies increased discharge/change in color or odor. Scant to no VB. After DC of MgSO4 yesterday, does not appreciate any contractions or cramping. Denies abdominal tenderness, fevers, chills. Reviewed MFM scan showing resolution of prior previa (indicating SVD can be attempted) and plan for repeat scan in 1wk. Patient would like to shower if possible  BP (!) 103/54 (BP Location: Left Arm)   Pulse 69   Temp (!) 97.5 F (36.4 C)   Resp 18   Ht 5\' 7"  (1.702 m)   Wt 74.8 kg   LMP 12/08/2020   SpO2 100%   BMI 25.84 kg/m  Gen: NAD, sitting up in bed CV: CTAB, RRR Abd: Gravid, F@U +1, NTTP, Bsx4 GU: Approx 2cm old dried blood on peripad MSK. Neg calf TTP/Homan's BL  Labs: WBC 22.5, H/H 10.9, plt 205 (7/11)           WBC 25.1, H/H 12.2/37.2, plt 220 (7/10)  A/P: This is a 25yo G3P0020 admitted HD#7 with PPROM. PNC c/b oligohydramnios noted at 21wk scan, significant placental lakes. -On PO latency antibiotics per protocol    *was briefly on IV for 24hrs while previous on MgSO4 due to resultant nausea from med -S/p BMTZ 7/6-7 -S/p both MFM and Neo consults, appreciate recs -MFM TVUS on 7/10 shows resolution of prior previa, f/u planned in 1wk.     *EFW 762g/86th%tile on 06/07/21 -S/p 24hrs MgSO4 on 7/10    *for period of new-onset cramping, resolved and stable this AM after DC    *NST q-shift -Anxiety: Zoloft 50mg  QHS, PRN ambien QHS  May shower this AM

## 2021-06-13 NOTE — Progress Notes (Signed)
Tracing in last 1.5hrs overall reactive, CE unchanged, no significant bloody discharge on this exam. Patient's intermittent cramping is resolved, no activity on TOCO.  Will still continue CEFM at this time but hold on L&D transfer as well as MgSO4. Pt aware to notify us of any changes.  BP 124/76 (BP Location: Left Arm)   Pulse 76   Temp 98.1 F (36.7 C) (Oral)   Resp 18   Ht 5\' 7"  (1.702 m)   Wt 74.8 kg   LMP 12/08/2020   SpO2 97%   BMI 25.84 kg/m

## 2021-06-14 LAB — CBC
HCT: 33.1 % — ABNORMAL LOW (ref 36.0–46.0)
Hemoglobin: 11 g/dL — ABNORMAL LOW (ref 12.0–15.0)
MCH: 32.1 pg (ref 26.0–34.0)
MCHC: 33.2 g/dL (ref 30.0–36.0)
MCV: 96.5 fL (ref 80.0–100.0)
Platelets: 212 10*3/uL (ref 150–400)
RBC: 3.43 MIL/uL — ABNORMAL LOW (ref 3.87–5.11)
RDW: 12.9 % (ref 11.5–15.5)
WBC: 11.6 10*3/uL — ABNORMAL HIGH (ref 4.0–10.5)
nRBC: 0 % (ref 0.0–0.2)

## 2021-06-14 LAB — TYPE AND SCREEN
ABO/RH(D): O POS
Antibody Screen: NEGATIVE

## 2021-06-14 MED ORDER — IBUPROFEN 600 MG PO TABS: 600.0000 mg | ORAL_TABLET | Freq: Four times a day (QID) | ORAL | Status: DC | PRN

## 2021-06-14 NOTE — Progress Notes (Addendum)
Patient ID: Vicki Hooper, female   DOB: May 04, 1996, 25 y.o.   MRN: [id number] HD# 8 Pt feels well this am. She reports no change in cramping since last night - mild, tolerable; had worse cramping several days ago. She is appreciating FMs. She denies fever or chills. No complaints.   VSS; afeb EFM - 145, moderate variability. Appropriate for GA TOCO - rare contraction apart once but none for at least an                hour prior SVE - deferred  A/P: 25yo G3P0020 at 25 0/7wks with PPROM x 8 days and         oligohydramnios noted at 21wk scan, significant placental lakes. - Placenta previa - RESOLVED ; vertex on last scan - clear for svd  - S/P latency antibiotics per protocol  - S/P BMZ  on 7/6 and 7/7 - S/P both MFM and Neo consults, appreciate recs - Last MFM TVUS was on 7/10 l next in 1 week ( 7/17)             *EFW 762g/86th%tile - S/P 24hrs MgSO4 on 7/10 - stopped once cramping resolved     - Plan to switch from CEFM to NST q-shift and prn contractions.  - Hx anxiety: Zoloft 50mg  QHS, PRN ambien QHS

## 2021-06-14 NOTE — Progress Notes (Signed)
Initial Nutrition Assessment  DOCUMENTATION CODES:  Not applicable  INTERVENTION:  Regular Diet Pt may order double protein portions and snacks TID if she makes request when ordering meals   NUTRITION DIAGNOSIS:   Increased nutrient needs related to  (pregnancy and fetal growth requirements) as evidenced by  (25 weeks IUP).  GOAL:  Patient will meet greater than or equal to 90% of their needs  MONITOR:  Weight trends  REASON FOR ASSESSMENT:  Antenatal, LOS   ASSESSMENT:  Now 25  0/7 weeks, adm w/ PROM. pre-preg weight 139 lbs/63 kg, BMI 21.8. 26 lb weight gain to date  Diet Order:   Diet Order             Diet regular Room service appropriate? Yes; Fluid consistency: Thin  Diet effective now                  EDUCATION NEEDS:  No education needs have been identified at this time  Height:  Ht Readings from Last 1 Encounters:  06/07/21 5\' 7"  (1.702 m)   Weight:  Wt Readings from Last 1 Encounters:  06/07/21 74.8 kg   Ideal Body Weight:   135 lbs  BMI:  Body mass index is 25.84 kg/m.  Estimated Nutritional Needs:   Kcal:  2000 - 2200  Protein:  90-100 g  Fluid:  2.3 L

## 2021-06-14 NOTE — Progress Notes (Signed)
Arrived to patient's room. She declined the PIV restart at this time. She is requesting that "it be started when she is getting her mag infusion." Secure chat sent to her nurse Terri RN notify of patient decline and requesting a reconsult when patient is ready for IV restart. Tomasita Morrow, RN VAST

## 2021-06-15 ENCOUNTER — Inpatient Hospital Stay (HOSPITAL_COMMUNITY): Payer: BC Managed Care – PPO | Admitting: Anesthesiology

## 2021-06-15 ENCOUNTER — Encounter (HOSPITAL_COMMUNITY): Payer: Self-pay | Admitting: Obstetrics and Gynecology

## 2021-06-15 ENCOUNTER — Inpatient Hospital Stay (HOSPITAL_COMMUNITY): Payer: BC Managed Care – PPO

## 2021-06-15 LAB — COMPREHENSIVE METABOLIC PANEL
ALT: 31 U/L (ref 0–44)
AST: 19 U/L (ref 15–41)
Albumin: 3 g/dL — ABNORMAL LOW (ref 3.5–5.0)
Alkaline Phosphatase: 81 U/L (ref 38–126)
Anion gap: 8 (ref 5–15)
BUN: 7 mg/dL (ref 6–20)
CO2: 23 mmol/L (ref 22–32)
Calcium: 7.1 mg/dL — ABNORMAL LOW (ref 8.9–10.3)
Chloride: 101 mmol/L (ref 98–111)
Creatinine, Ser: 0.43 mg/dL — ABNORMAL LOW (ref 0.44–1.00)
GFR, Estimated: >60 mL/min (ref >60)
Glucose, Bld: 113 mg/dL — ABNORMAL HIGH (ref 70–99)
Potassium: 3.4 mmol/L — ABNORMAL LOW (ref 3.5–5.1)
Sodium: 132 mmol/L — ABNORMAL LOW (ref 135–145)
Total Bilirubin: 0.6 mg/dL (ref 0.3–1.2)
Total Protein: 6.3 g/dL — ABNORMAL LOW (ref 6.5–8.1)

## 2021-06-15 LAB — CBC
HCT: 34.6 % — ABNORMAL LOW (ref 36.0–46.0)
Hemoglobin: 11.6 g/dL — ABNORMAL LOW (ref 12.0–15.0)
MCH: 31.9 pg (ref 26.0–34.0)
MCHC: 33.5 g/dL (ref 30.0–36.0)
MCV: 95.1 fL (ref 80.0–100.0)
Platelets: 216 10*3/uL (ref 150–400)
RBC: 3.64 MIL/uL — ABNORMAL LOW (ref 3.87–5.11)
RDW: 12.6 % (ref 11.5–15.5)
WBC: 22.6 10*3/uL — ABNORMAL HIGH (ref 4.0–10.5)
nRBC: 0 % (ref 0.0–0.2)

## 2021-06-15 MED ORDER — ACETAMINOPHEN 325 MG PO TABS: 650.0000 mg | ORAL_TABLET | ORAL | Status: DC | PRN

## 2021-06-15 MED ORDER — PHENYLEPHRINE 40 MCG/ML (10ML) SYRINGE FOR IV PUSH (FOR BLOOD PRESSURE SUPPORT): 80.0000 ug | PREFILLED_SYRINGE | INTRAVENOUS | Status: DC | PRN

## 2021-06-15 MED ORDER — BUTORPHANOL TARTRATE 1 MG/ML IJ SOLN
1.0000 mg | INTRAMUSCULAR | Status: DC | PRN
Start: 2021-06-15 — End: 2021-06-16

## 2021-06-15 MED ORDER — METHYLERGONOVINE MALEATE 0.2 MG/ML IJ SOLN: 0.2000 mg | Freq: Once | INTRAMUSCULAR | Status: AC

## 2021-06-15 MED ORDER — ONDANSETRON HCL 4 MG PO TABS: 4.0000 mg | ORAL_TABLET | ORAL | Status: DC | PRN

## 2021-06-15 MED ORDER — EPHEDRINE 5 MG/ML INJ: 10.0000 mg | INTRAVENOUS | Status: DC | PRN

## 2021-06-15 MED ORDER — SODIUM CHLORIDE 0.9 % IV SOLN
3.0000 g | Freq: Four times a day (QID) | INTRAVENOUS | Status: AC
  Administered 2021-06-15 (×2): 3 g via INTRAVENOUS
  Filled 2021-06-15 (×4): qty 8

## 2021-06-15 MED ORDER — ONDANSETRON 4 MG PO TBDP: 4.0000 mg | ORAL_TABLET | Freq: Three times a day (TID) | ORAL | Status: DC | PRN

## 2021-06-15 MED ORDER — METHYLERGONOVINE MALEATE 0.2 MG/ML IJ SOLN
INTRAMUSCULAR | Status: AC
  Administered 2021-06-15: 0.2 mg via INTRAMUSCULAR
  Filled 2021-06-15: qty 1

## 2021-06-15 MED ORDER — BETAMETHASONE SOD PHOS & ACET 6 (3-3) MG/ML IJ SUSP
12.0000 mg | INTRAMUSCULAR | Status: DC
  Administered 2021-06-15: 12 mg via INTRAMUSCULAR
  Filled 2021-06-15: qty 5

## 2021-06-15 MED ORDER — FENTANYL-BUPIVACAINE-NACL 0.5-0.125-0.9 MG/250ML-% EP SOLN
12.0000 mL/h | EPIDURAL | Status: DC | PRN
Start: 2021-06-15 — End: 2021-06-15
  Administered 2021-06-15: 12 mL/h via EPIDURAL

## 2021-06-15 MED ORDER — WITCH HAZEL-GLYCERIN EX PADS: 1.0000 "application " | MEDICATED_PAD | CUTANEOUS | Status: DC | PRN

## 2021-06-15 MED ORDER — ONDANSETRON HCL 4 MG/2ML IJ SOLN: 4.0000 mg | Freq: Four times a day (QID) | INTRAMUSCULAR | Status: DC | PRN

## 2021-06-15 MED ORDER — LACTATED RINGERS IV SOLN
500.0000 mL | Freq: Once | INTRAVENOUS | Status: AC
  Administered 2021-06-15: 500 mL via INTRAVENOUS

## 2021-06-15 MED ORDER — TETANUS-DIPHTH-ACELL PERTUSSIS 5-2.5-18.5 LF-MCG/0.5 IM SUSY
0.5000 mL | PREFILLED_SYRINGE | Freq: Once | INTRAMUSCULAR | Status: AC
  Administered 2021-06-16: 0.5 mL via INTRAMUSCULAR
  Filled 2021-06-15: qty 0.5

## 2021-06-15 MED ORDER — ZOLPIDEM TARTRATE 5 MG PO TABS: 5.0000 mg | ORAL_TABLET | Freq: Every evening | ORAL | Status: DC | PRN

## 2021-06-15 MED ORDER — SOD CITRATE-CITRIC ACID 500-334 MG/5ML PO SOLN: 30.0000 mL | ORAL | Status: DC | PRN

## 2021-06-15 MED ORDER — PRENATAL MULTIVITAMIN CH
1.0000 | ORAL_TABLET | Freq: Every day | ORAL | Status: DC
  Administered 2021-06-16: 1 via ORAL
  Filled 2021-06-15: qty 1

## 2021-06-15 MED ORDER — LACTATED RINGERS IV SOLN: INTRAVENOUS | Status: DC

## 2021-06-15 MED ORDER — DIBUCAINE (PERIANAL) 1 % EX OINT: 1.0000 "application " | TOPICAL_OINTMENT | CUTANEOUS | Status: DC | PRN

## 2021-06-15 MED ORDER — OXYCODONE-ACETAMINOPHEN 5-325 MG PO TABS: 2.0000 | ORAL_TABLET | ORAL | Status: DC | PRN

## 2021-06-15 MED ORDER — COCONUT OIL OIL: 1.0000 "application " | TOPICAL_OIL | Status: DC | PRN

## 2021-06-15 MED ORDER — SODIUM CHLORIDE 0.9 % IV SOLN
1.0000 g | INTRAVENOUS | Status: DC
  Administered 2021-06-15: 1 g via INTRAVENOUS
  Filled 2021-06-15: qty 1000

## 2021-06-15 MED ORDER — LIDOCAINE HCL (PF) 1 % IJ SOLN
INTRAMUSCULAR | Status: DC | PRN
  Administered 2021-06-15 (×2): 3 mL via EPIDURAL

## 2021-06-15 MED ORDER — OXYTOCIN BOLUS FROM INFUSION
333.0000 mL | Freq: Once | INTRAVENOUS | Status: AC
  Administered 2021-06-15: 333 mL via INTRAVENOUS

## 2021-06-15 MED ORDER — ONDANSETRON HCL 4 MG/2ML IJ SOLN: 4.0000 mg | INTRAMUSCULAR | Status: DC | PRN

## 2021-06-15 MED ORDER — IBUPROFEN 600 MG PO TABS
600.0000 mg | ORAL_TABLET | Freq: Four times a day (QID) | ORAL | Status: DC
  Administered 2021-06-15 – 2021-06-16 (×3): 600 mg via ORAL
  Filled 2021-06-15 (×4): qty 1

## 2021-06-15 MED ORDER — LIDOCAINE HCL (PF) 1 % IJ SOLN: 30.0000 mL | INTRAMUSCULAR | Status: DC | PRN

## 2021-06-15 MED ORDER — SODIUM CHLORIDE 0.9 % IV SOLN
2.0000 g | Freq: Once | INTRAVENOUS | Status: AC
  Administered 2021-06-15: 2 g via INTRAVENOUS
  Filled 2021-06-15: qty 2000

## 2021-06-15 MED ORDER — BENZOCAINE-MENTHOL 20-0.5 % EX AERO: 1.0000 "application " | INHALATION_SPRAY | CUTANEOUS | Status: DC | PRN

## 2021-06-15 MED ORDER — FENTANYL-BUPIVACAINE-NACL 0.5-0.125-0.9 MG/250ML-% EP SOLN
EPIDURAL | Status: AC
  Filled 2021-06-15: qty 250

## 2021-06-15 MED ORDER — OXYCODONE-ACETAMINOPHEN 5-325 MG PO TABS: 1.0000 | ORAL_TABLET | ORAL | Status: DC | PRN

## 2021-06-15 MED ORDER — OXYTOCIN-SODIUM CHLORIDE 30-0.9 UT/500ML-% IV SOLN
2.5000 [IU]/h | INTRAVENOUS | Status: DC
  Filled 2021-06-15: qty 500

## 2021-06-15 MED ORDER — SENNOSIDES-DOCUSATE SODIUM 8.6-50 MG PO TABS
2.0000 | ORAL_TABLET | ORAL | Status: DC
  Administered 2021-06-16: 2 via ORAL
  Filled 2021-06-15: qty 2

## 2021-06-15 MED ORDER — OXYCODONE HCL 5 MG PO TABS
5.0000 mg | ORAL_TABLET | ORAL | Status: DC | PRN
  Administered 2021-06-15 (×2): 5 mg via ORAL
  Filled 2021-06-15 (×2): qty 1

## 2021-06-15 MED ORDER — MAGNESIUM SULFATE 40 GM/1000ML IV SOLN
2.0000 g/h | INTRAVENOUS | Status: DC
  Administered 2021-06-15: 2 g/h via INTRAVENOUS
  Filled 2021-06-15: qty 1000

## 2021-06-15 MED ORDER — LACTATED RINGERS IV SOLN: 500.0000 mL | INTRAVENOUS | Status: DC | PRN

## 2021-06-15 MED ORDER — DIPHENHYDRAMINE HCL 25 MG PO CAPS: 25.0000 mg | ORAL_CAPSULE | Freq: Four times a day (QID) | ORAL | Status: DC | PRN

## 2021-06-15 MED ORDER — MAGNESIUM SULFATE BOLUS VIA INFUSION
6.0000 g | Freq: Once | INTRAVENOUS | Status: AC
  Administered 2021-06-15: 6 g via INTRAVENOUS
  Filled 2021-06-15: qty 1000

## 2021-06-15 MED ORDER — DIPHENHYDRAMINE HCL 50 MG/ML IJ SOLN: 12.5000 mg | INTRAMUSCULAR | Status: DC | PRN

## 2021-06-15 MED ORDER — SIMETHICONE 80 MG PO CHEW: 80.0000 mg | CHEWABLE_TABLET | ORAL | Status: DC | PRN

## 2021-06-15 NOTE — Anesthesia Preprocedure Evaluation (Signed)
Anesthesia Evaluation  Patient identified by MRN, date of birth, ID band  Reviewed: Allergy & Precautions, Patient's Chart, lab work & pertinent test results  Airway Mallampati: II  TM Distance: >3 FB Neck ROM: Full    Dental no notable dental hx.    Pulmonary neg pulmonary ROS, former smoker,    Pulmonary exam normal breath sounds clear to auscultation       Cardiovascular negative cardio ROS Normal cardiovascular exam Rhythm:Regular Rate:Normal     Neuro/Psych negative neurological ROS     GI/Hepatic negative GI ROS, Neg liver ROS,   Endo/Other  negative endocrine ROS  Renal/GU negative Renal ROS     Musculoskeletal negative musculoskeletal ROS (+)   Abdominal   Peds  Hematology negative hematology ROS (+)   Anesthesia Other Findings   Reproductive/Obstetrics (+) Pregnancy                             Anesthesia Physical Anesthesia Plan  ASA: 2  Anesthesia Plan: Epidural   Post-op Pain Management:    Induction:   PONV Risk Score and Plan:   Airway Management Planned:   Additional Equipment:   Intra-op Plan:   Post-operative Plan:   Informed Consent: I have reviewed the patients History and Physical, chart, labs and discussed the procedure including the risks, benefits and alternatives for the proposed anesthesia with the patient or authorized representative who has indicated his/her understanding and acceptance.       Plan Discussed with:   Anesthesia Plan Comments:         Anesthesia Quick Evaluation

## 2021-06-15 NOTE — Lactation Note (Signed)
This note was copied from a baby's chart. Lactation Consultation Note  Patient Name: Vicki Hooper Date: 06/15/2021   Age:27 hours  Attempted to visit with mom in L&D, but RN Baxter Hire report that they're still working with mom trying to get some fragment out. Will attempt to visit again once she gets transferred to her room, per RN mom is planning on pumping/BF.  Maternal Data    Feeding    Lactation Tools Discussed/Used    Interventions    Discharge    Consult Status      Vicki Hooper Vicki Hooper 06/15/2021, 3:23 PM

## 2021-06-15 NOTE — Progress Notes (Signed)
Patient ID: Vicki Hooper, female   DOB: 12/25/1995, 25 y.o.   MRN: [id number] I was called and informed that patient was complaining of abdominal cramping and pelvic pain. She was requesting pain medication and requesting to use the bathroom.  Per the nurse, there were no contractions noted on the monitor and patients abdomen was not firm during cramping bouts.  I informed the nurse I would be en route to examine her however I also asked if there was any provider present currently in the hospital who was comfortable examining the patient in real time - there was not. On arrival in the patients room, she reported her pain was more definitive: it was occurring about every 2 mins and had increased in intensity from 5/10 to 8/10. Pt was conversant and smiling in between contractions but visibly uncomfortable when they occurred.  VSS EFM - not on monitor TOCO - irritability noted SVE - 1/90/-2, firm, slightly posterior. Cervical bulge noted during contraction            Small amount of blood tinged fluid noted  A/P: 25yo G3P0020 female at 71 1/7wks with PPROM x 8 days and oligohydramnios          - preterm contractions now noted Plan to restart MgSO4 with a 6gm infusion bolus then run at 2gm/hr; IVF Continuous EFM to start now Pt counseled regarding possibility of imminent delivery if contractions persist. She reports she has been appropriately counseled. Has no new questions at this time   - Placenta previa - RESOLVED ; vertex on last scan - clear for svd - S/P latency antibiotics per protocol - S/P BMZ  on 7/6 and 7/7 - S/P both MFM and Neo consults, appreciate recs - Last MFM TVUS was on 7/10 l next in 1 week ( 7/17)             *EFW 762g/86th%tile - S/P 24hrs MgSO4 on 7/10 - stopped once cramping resolved - Hx anxiety: Zoloft 50mg  QHS, PRN ambien QHS

## 2021-06-15 NOTE — Progress Notes (Signed)
Patient ID: Vicki Hooper, female   DOB: [birth year], 25 y.o.   MRN: [id number] Pt now comfortable with epidural  Afeb VSS FHR 130 with variability and no decels  Cervix 90/5/-1 at 840am Some light VB   Pt progressing in labor S/p betamethasone x 2 06/07/21 and 06/08/21 On magnesium for neuroprotection On ampicillin for preterm status and labor, GBS unknown

## 2021-06-15 NOTE — Progress Notes (Addendum)
Patient ID: Vicki Hooper, female   DOB: 1995/12/16, 25 y.o.   MRN: [id number] I was called at 6:55 and informed that patient was now reporting pain with contractions back at 8/10. Labor and delivery still with staffing shortage hence patient unable to be transferred to the floor at this time  On exam, pt is now 4/90/-2.  VSS - afebrile   A/P: 25yo G3P0020 female at 12 1/7wks with PPROM x 8 days and oligohydramnios         - in preterm labor - Transfer to L/D once bed available for anticipated imminent delivery - Dr Eric Form ( neonatologist) informed of patient status - he will inform incoming MD     - NICU equipment and delivery instruments present at bedside on [facility]  Continue with MgSO4 at 2gm/hr ( s/p bolus of 6gm) Start on Ampicillin for unknown GBS status ( imminent delivery)   - Placenta previa - RESOLVED ; vertex on last scan - clear for svd - S/P latency antibiotics per protocol - S/P BMZ  on 7/6 and 7/7 - S/P both MFM and Neo consults, appreciate recs - Last MFM TVUS was on 7/10 l next in 1 week ( 7/17)             *EFW 762g/86th%tile - S/P 24hrs MgSO4 on 7/10 - stopped once cramping resolved - Hx anxiety: Zoloft 50mg  QHS, PRN ambien QHS

## 2021-06-15 NOTE — Anesthesia Procedure Notes (Signed)
Epidural Patient location during procedure: OB Start time: 06/15/2021 8:05 AM End time: 06/15/2021 8:25 AM  Staffing Anesthesiologist: Lewie Loron, MD Performed: anesthesiologist   Preanesthetic Checklist Completed: patient identified, IV checked, risks and benefits discussed, monitors and equipment checked, pre-op evaluation and timeout performed  Epidural Patient position: sitting Prep: DuraPrep and site prepped and draped Patient monitoring: heart rate, continuous pulse ox and blood pressure Approach: midline Injection technique: LOR air and LOR saline  Needle:  Needle type: Tuohy  Needle gauge: 17 G Needle length: 9 cm Needle insertion depth: 4.5 cm Catheter type: closed end flexible Catheter size: 19 Gauge Catheter at skin depth: 10 cm Test dose: negative  Assessment Sensory level: T8 Events: blood not aspirated, injection not painful, no injection resistance, no paresthesia and negative IV test  Additional Notes Reason for block:procedure for pain

## 2021-06-15 NOTE — Progress Notes (Signed)
Patient ID: Vicki Hooper, female   DOB: 05-07-1996, 25 y.o.   MRN: [id number] Pt reports contractions more spaced out and is now able to doze off in between them. She occasionally has one that is just as intense as before but no increase in pelvic pressure.   Plan to continue as per previous plan

## 2021-06-15 NOTE — Lactation Note (Signed)
This note was copied from a baby's chart. Lactation Consultation Note  Patient Name: Vicki Hooper [id number] Date: 06/15/2021 Reason for consult: L&D Initial assessment;Preterm <34wks;NICU baby;Infant < 6lbs;Primapara;1st time breastfeeding Age:25 years  Visited with mom of 1 hour old pre-term NICU female, she's a P1 and reported (++) breast changes during the pregnancy, she started leaking at 24 weeks.  Assisted mom with breast massage and hand expression, she was able to get colostrum out of both breast very easily; praised her for her efforts. Reviewed pumping schedule, breastmilk storage guidelines, lactogenesis II and benefits of premature milk for NICU babies.  Plan of care  Mom will be set up with a DEBP once she gets to her OB Specialty care room She'll pump every 2-3 hours, at least 8 pumping sessions/24 hours Breast massage and hand expression were also encouraged prior pumping  BF brochure, BF resources and NICU booklet were reviewed. FOB present. Parents reported all questions and concerns were answered, they're both aware of LC NICU services and will call PRN.  Maternal Data Has patient been taught Hand Expression?: Yes Does the patient have breastfeeding experience prior to this delivery?: No  Feeding Mother's Current Feeding Choice: Breast Milk  Lactation Tools Discussed/Used    Interventions Interventions: Breast feeding basics reviewed;Education;Breast massage;Hand express  Discharge Pump: Advised to call insurance company (she has Acupuncturist) WIC Program: No  Consult Status Consult Status: Follow-up Date: 06/16/21 Follow-up type: In-patient    Vicki Hooper Vicki Hooper 06/15/2021, 4:06 PM

## 2021-06-15 NOTE — Progress Notes (Signed)
Patient ID: Vicki Hooper, female   DOB: 12-08-1995, 25 y.o.   MRN: [id number] After MgSO4 bolus and one dose of percocet and zofran ODT, pt reports pain now slightly less at 6/10 ( from 8/10). She is still appreciating contractions though still not picking up on monitor.  VSS EFM - 145, moderate variability, no decelerations; cat 1 TOCO - less irritability, no definitive contractions SVE - 3/90/-2  A/P: 25yo [id number] female at 33 1/7wks with PPROM x 8 days and oligohydramnios         - preterm contractions now noted; at risk for preterm labor  Plan to continue with MgSO4 at 2gm/hr ( s/p bolus of 6gm) If ctxs increase in intensity or frequency, plan to transfer pt to L/D. Staff/Pt counseled  Continuous EFM/TOCO     - Placenta previa - RESOLVED ; vertex on last scan - clear for svd - S/P latency antibiotics per protocol - S/P BMZ  on 7/6 and 7/7 - S/P both MFM and Neo consults, appreciate recs - Last MFM TVUS was on 7/10 l next in 1 week ( 7/17)             *EFW 762g/86th%tile - S/P 24hrs MgSO4 on 7/10 - stopped once cramping resolved - Hx anxiety: Zoloft 50mg  QHS, PRN ambien QHS

## 2021-06-15 NOTE — Progress Notes (Signed)
Chaplain paged for Neonatal Code Blue. Chaplain reported to pt room. Notified by RNs that baby had been stabilized and medical team is working on an issue with placenta. Baby being transported to NICU while I was at the nurses' station. Our team will continue to follow.  Please page as further needs arise.  Maryanna Shape. Carley Hammed, M.Div. [facility] Chaplain Pager [phone] Office [phone]

## 2021-06-15 NOTE — Progress Notes (Signed)
Patient ID: Vicki Hooper, female   DOB: September 08, 1996, 25 y.o.   MRN: [id number] Pt feeling some anterior pressure  Afeb VSS  FHR with no decels, some small accels  80/6-7/-1 to 0  Continues to progress Following closely

## 2021-06-16 ENCOUNTER — Other Ambulatory Visit (HOSPITAL_COMMUNITY): Payer: Self-pay

## 2021-06-16 LAB — CBC
HCT: 29.8 % — ABNORMAL LOW (ref 36.0–46.0)
Hemoglobin: 10 g/dL — ABNORMAL LOW (ref 12.0–15.0)
MCH: 32.3 pg (ref 26.0–34.0)
MCHC: 33.6 g/dL (ref 30.0–36.0)
MCV: 96.1 fL (ref 80.0–100.0)
Platelets: 250 10*3/uL (ref 150–400)
RBC: 3.1 MIL/uL — ABNORMAL LOW (ref 3.87–5.11)
RDW: 12.8 % (ref 11.5–15.5)
WBC: 25 10*3/uL — ABNORMAL HIGH (ref 4.0–10.5)
nRBC: 0 % (ref 0.0–0.2)

## 2021-06-16 LAB — RPR: RPR Ser Ql: NONREACTIVE

## 2021-06-16 MED ORDER — IBUPROFEN 600 MG PO TABS
600.0000 mg | ORAL_TABLET | Freq: Four times a day (QID) | ORAL | 1 refills | Status: DC | PRN
  Filled 2021-06-16: qty 40, 10d supply, fill #0

## 2021-06-16 MED ORDER — SERTRALINE HCL 25 MG PO TABS
25.0000 mg | ORAL_TABLET | Freq: Every day | ORAL | 1 refills | Status: DC
  Filled 2021-06-16: qty 30, 30d supply, fill #0

## 2021-06-16 MED ORDER — SERTRALINE HCL 50 MG PO TABS
50.0000 mg | ORAL_TABLET | Freq: Every day | ORAL | 1 refills | Status: DC
Start: 2021-06-16 — End: 2022-11-20
  Filled 2021-06-16: qty 30, 30d supply, fill #0

## 2021-06-16 NOTE — Anesthesia Postprocedure Evaluation (Signed)
Anesthesia Post Note  Patient: Vicki Hooper  Procedure(s) Performed: AN AD HOC LABOR EPIDURAL     Anesthesia Type: Epidural Anesthetic complications: no   No notable events documented.  Last Vitals:  Vitals:   06/16/21 0343 06/16/21 0823  BP: 136/69 122/68  Pulse: 74 76  Resp: 18 16  Temp: 36.7 C 36.8 C  SpO2:  100%    Last Pain:  Vitals:   06/16/21 0823  TempSrc: Oral  PainSc:    Pain Goal: Patients Stated Pain Goal: 0 (06/16/21 0343)                 Cephus Shelling

## 2021-06-16 NOTE — Progress Notes (Addendum)
Patient ID: Vicki Hooper, female   DOB: May 26, 1996, 25 y.o.   MRN: [id number] Pt is doing well with no complaints this am. She is ambulating well, voiding with no difficulty , noting colostrum on expression, and tolerating PO. She reports no HA, blurry vision, CP, SOB or HA. She reports lochia is mild. Plans to see baby shortly VSS GEN - NAD< smiling ABD - soft, flat, FF 3cm below umbilicus EXT - no homans or edema   25>10<250  A/P: PPD#1 s/p precipitous svd of preterm infant - stable         Routine pp care
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
## 2021-06-16 NOTE — Discharge Instructions (Signed)
Call office with any concerns [phone] 

## 2021-06-16 NOTE — Discharge Summary (Signed)
Postpartum Discharge Summary  Date of Service updated      Patient Name: Vicki Hooper DOB: 1995/12/10 MRN: [id number]  Date of admission: 06/07/2021 Delivery date:06/15/2021  Delivering provider: Marylene Land  Date of discharge: 06/16/2021  Admitting diagnosis: Preterm premature rupture of membranes (PPROM) with unknown onset of labor [O42.919] NSVD (normal spontaneous vaginal delivery) [O80] Intrauterine pregnancy: [redacted]w[redacted]d     Secondary diagnosis:  Active Problems:   Preterm premature rupture of membranes (PPROM) with unknown onset of labor   NSVD (normal spontaneous vaginal delivery)  Additional problems: oligohydramnious, anxiety    Discharge diagnosis: Preterm Pregnancy Delivered                                              Post partum procedures:curettage  Augmentation: N/A Complications: ROM>24 hours  Hospital course: Onset of Labor With Vaginal Delivery      25 y.o. yo [id number] at [redacted]w[redacted]d was admitted in Latent Labor on 06/07/2021. Patient had an uncomplicated labor course as follows:  Membrane Rupture Time/Date: 8:00 PM ,06/04/2021   Delivery Method:Vaginal, Spontaneous  Episiotomy: None  Lacerations:    Patient had an uncomplicated postpartum course.  She is ambulating, tolerating a regular diet, passing flatus, and urinating well. Patient is discharged home in stable condition on 06/16/21.  Newborn Data: Birth date:06/15/2021  Birth time:2:35 PM  Gender:Female  Living status:Living  Apgars:4 ,7  Weight:780 g   Magnesium Sulfate received: Yes: Neuroprotection BMZ received: Yes Physical exam  Vitals:   06/15/21 1750 06/15/21 1959 06/16/21 0343 06/16/21 0823  BP: 124/69 137/64 136/69 122/68  Pulse: 91 96 74 76  Resp: 18 18 18 16   Temp: 98.8 F (37.1 C) 97.6 F (36.4 C) 98 F (36.7 C) 98.2 F (36.8 C)  TempSrc: Axillary Oral Oral Oral  SpO2: 99%   100%  Weight:      Height:       General: alert, cooperative, and no distress Lochia:  appropriate Uterine Fundus: firm Incision: N/A DVT Evaluation: No evidence of DVT seen on physical exam. Labs: Lab Results  Component Value Date   WBC 25.0 (H) 06/16/2021   HGB 10.0 (L) 06/16/2021   HCT 29.8 (L) 06/16/2021   MCV 96.1 06/16/2021   PLT 250 06/16/2021   CMP Latest Ref Rng & Units 06/15/2021  Glucose 70 - 99 mg/dL 06/17/2021)  BUN 6 - 20 mg/dL 7  Creatinine 950(D - 3.26 mg/dL 7.12)  Sodium 4.58(K - 998 mmol/L 132(L)  Potassium 3.5 - 5.1 mmol/L 3.4(L)  Chloride 98 - 111 mmol/L 101  CO2 22 - 32 mmol/L 23  Calcium 8.9 - 10.3 mg/dL 7.1(L)  Total Protein 6.5 - 8.1 g/dL 6.3(L)  Total Bilirubin 0.3 - 1.2 mg/dL 0.6  Alkaline Phos 38 - 126 U/L 81  AST 15 - 41 U/L 19  ALT 0 - 44 U/L 31   Edinburgh Score: Edinburgh Postnatal Depression Scale Screening Tool 06/16/2021  I have been able to laugh and see the funny side of things. 0  I have looked forward with enjoyment to things. 0  I have blamed myself unnecessarily when things went wrong. 1  I have been anxious or worried for no good reason. 2  I have felt scared or panicky for no good reason. 2  Things have been getting on top of me. 2  I have been so unhappy  that I have had difficulty sleeping. 0  I have felt sad or miserable. 0  I have been so unhappy that I have been crying. 1  The thought of harming myself has occurred to me. 0  Edinburgh Postnatal Depression Scale Total 8      After visit meds:     Discharge home in stable condition Infant Feeding:  tube Infant Disposition:NICU Discharge instruction: per After Visit Summary and Postpartum booklet. Activity: Advance as tolerated. Pelvic rest for 6 weeks.  Diet: routine diet Anticipated Birth Control: Unsure Postpartum Appointment:6 weeks Additional Postpartum F/U: Postpartum Depression checkup Future Appointments:No future appointments. Follow up Visit:  Follow-up Information     Huel Cote, MD. Schedule an appointment as soon as possible for a visit  in 6 week(s).   Specialty: Obstetrics and Gynecology Why: for postpartum visit Contact information: [address] [place] [address] [phone]                     06/16/2021 Cathrine Muster, DO

## 2021-06-16 NOTE — Progress Notes (Addendum)
Patient ID: Vicki Hooper, female   DOB: 07-02-1996, 25 y.o.   MRN: [id number] Called and informed pt would like to be discharged home today. Spoke to pt and she confirms this. States no change since I saw her. Pain well controlled. Denies fever.  She feels will recover better at home  Reviewed pp instructions. Rx sent for ibuprofen and zoloft.  F/U in6 weeks
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
## 2021-06-16 NOTE — Lactation Note (Signed)
This note was copied from a baby's chart. Lactation Consultation Note LC set up electric pump in infant's room and assisted mom with pumping. LC reviewed options for obtaining at-home electric pump. Mom is aware of LC services and will request f/u today if further assistance is needed to obtain pump. Mom to d/c today.   Patient Name: Vicki Hooper [id number] Date: 06/16/2021 Reason for consult: Follow-up assessment Age:27 hours  Maternal Data  Discharge Education: Engorgement and breast care Pump: DEBP;Advised to call insurance company  Feeding Mother's Current Feeding Choice: Breast Milk  Interventions Interventions: Education  Consult Status Consult Status: Follow-up Follow-up type: In-patient   [name], MA IBCLC 06/16/2021, 12:07 PM

## 2021-06-18 ENCOUNTER — Ambulatory Visit: Payer: Self-pay

## 2021-06-18 NOTE — Lactation Note (Signed)
This note was copied from a baby's chart. Lactation Consultation Note Mother is pumping frequently with symphony at hospital and hand pump at home. She ordered a Spectra pump and is awaiting delivery. Milk volume is wnl today and without s/s of engorgement. She is aware of the need to continue frequent breast pumping.   Patient Name: Vicki Hooper VFIEP'P Date: 06/18/2021 Reason for consult: Follow-up assessment Age:27 hours  Maternal Data  Tools: Pump Pumping frequency: q 2-3 Pumped volume: 20 mL  Feeding Mother's Current Feeding Choice: Breast Milk and Donor Milk  Interventions Interventions: Education  Discharge Discharge Education: Engorgement and breast care  Consult Status Consult Status: Follow-up Follow-up type: In-patient   [name], MA IBCLC 06/18/2021, 4:21 PM

## 2021-06-20 LAB — SURGICAL PATHOLOGY

## 2021-06-21 ENCOUNTER — Ambulatory Visit: Payer: Self-pay

## 2021-06-21 NOTE — Lactation Note (Signed)
This note was copied from a baby's chart. Lactation Consultation Note  Patient Name: Vicki Hooper NOMVE'H Date: 06/21/2021 Reason for consult: Follow-up assessment;1st time breastfeeding;Primapara;Infant < 6lbs;Preterm <34wks;NICU baby Age:27 years  Visited with mom of 27 years old pre-term NICU female, she's a P1 and reported that her Spectra pump still hasn't come in the mail, and she's been using her hand pump in the mean time.  This LC assisted mom with the paperwork to obtain a Stork pump, she has Acupuncturist. Mom will call her insurance company to get a tracking #; and if they can't provide one (due to lack of shipment) she'll contact lactation to fax the stork pump form (it's already completed) and order her DEBP through [facility].  Reviewed engorgement prevention/treatment, mom reports she's fully draining her breast and has no pain/discomfort so far.  Plan of care   Mom will continue pumping every 2-3 hours, at least 8 pumping sessions/24 hours Breast massage and hand expression were also encouraged prior and post pumping Mom will contact lactation if she needs to order her Stork pump   FOB present. Parents reported all questions and concerns were answered, they're both aware of LC NICU services and will call PRN.  Maternal Data   Mom's milk supply is WNL  Feeding Mother's Current Feeding Choice: Breast Milk and Donor Milk  Lactation Tools Discussed/Used Tools: Pump Breast pump type: Manual Pump Education: Setup, frequency, and cleaning;Milk Storage Reason for Pumping: Pre-term NICU infant < 2 lbs Pumping frequency: q 2-3 hours Pumped volume: 50 mL (50-60)  Interventions Interventions: Breast feeding basics reviewed;Education  Discharge Pump: Manual (refer to Stork pump, in case her Spectra hasn't shipped yet)  Consult Status Consult Status: Follow-up Follow-up type: In-patient    Vicki Hooper 06/21/2021, 3:51 PM

## 2021-06-22 ENCOUNTER — Ambulatory Visit: Payer: BC Managed Care – PPO

## 2021-06-24 ENCOUNTER — Ambulatory Visit: Payer: Self-pay

## 2021-06-24 NOTE — Lactation Note (Signed)
This note was copied from a baby's chart. Lactation Consultation Note  Patient Name: Vicki Hooper IEPPI'R Date: 06/24/2021 Reason for consult: Follow-up assessment Age:27 days  Lactation followed up with Vicki Hooper. Her personal (Spectra) pump has arrived from insurance and she reports that she has no concerns with using it. She states that she is pumping consistently, and her milk volumes are about 60 mls/session. We discussed baby's intake needs as she grows, and I reinforced pumping education.   Feeding Mother's Current Feeding Choice: Breast Milk   Lactation Tools Discussed/Used Tools: Pump Breast pump type: Double-Electric Breast Pump;Other (comment) (Spectra) Pump Education: Setup, frequency, and cleaning Reason for Pumping: NICU; preterm Pumping frequency: q2-3 hours - 8 times/day Pumped volume: 60 mL  Interventions Interventions: Education  Discharge Pump: Personal;DEBP (Spectra)  Consult Status Consult Status: Follow-up Follow-up type: In-patient    Walker Shadow 06/24/2021, 2:59 PM

## 2021-06-27 ENCOUNTER — Telehealth (HOSPITAL_COMMUNITY): Payer: Self-pay | Admitting: *Deleted

## 2021-06-27 NOTE — Telephone Encounter (Signed)
Mom reports feeling great and recovering quickly after delivery. EPDS= 6 [facility] score =8). No concerns about herself. Baby is in NICU and doing well per mom.  Duffy Rhody, RN 06/27/2021 at 2:39pm

## 2021-06-30 ENCOUNTER — Ambulatory Visit: Payer: Self-pay

## 2021-06-30 NOTE — Lactation Note (Signed)
This note was copied from a baby's chart. Lactation Consultation Note  Patient Name: Vicki Hooper [id number] Date: 06/30/2021 Reason for consult: Follow-up assessment;NICU baby Age:27 wk.o.  Follow up phone call to [name]. She reports no changes in her pumping status. She mainly uses her Spectra pump from home, and is happy with it. She uses the symphony pump while in the NICU.   Lactation Tools Discussed/Used Tools: Pump Breast pump type: Double-Electric Breast Pump;Other (comment) (Personal - Spectra) Pump Education: Setup, frequency, and cleaning Reason for Pumping: NICU; preterm Pumping frequency: 8 times/day Pumped volume: 75 mL (60-90 mls/session)  Interventions Interventions: Breast feeding basics reviewed  Discharge    Consult Status Consult Status: Follow-up Follow-up type: In-patient    Walker Shadow 06/30/2021, 3:36 PM

## 2021-07-02 ENCOUNTER — Ambulatory Visit: Payer: Self-pay

## 2021-07-02 NOTE — Lactation Note (Signed)
This note was copied from a baby's chart. Lactation Consultation Note  Patient Name: Vicki Hooper KGMWN'U Date: 07/02/2021 Reason for consult: Follow-up assessment;NICU baby;Primapara;1st time breastfeeding;Infant < 6lbs;Preterm <34wks Age:27 wk.o.  Visited with mom of 72 70/25 weeks old (adjusted) NICU female, she continues to pump consistently and was happy to report that the NICU has told her to stop bringing milk because we have "enough". She's planning on buying a deep freezer to store all the other bottles she has a at home, praised her for her efforts.  Baby was awake today, mom was very pleased. Reviewed pumping volumes and frequency she's right on target @ the 2 week mark but told LC that sometimes she might miss a pumping session during the night. She's very careful and does breast massage and hand expression if the DEBP doesn't fully empty the breasts.  Plan of care   Mom will continue pumping every 2-3 hours, at least 8 pumping sessions/24 hours Breast massage and hand expression were also encouraged prior and post pumping She'll power pump in the AM if she misses a pumping session during the night   Mom reported all questions and concerns were answered, she's aware of LC NICU services and will call PRN.  Maternal Data   Mom's milk volume is WNL  Feeding Mother's Current Feeding Choice: Breast Milk  Lactation Tools Discussed/Used Tools: Pump Breast pump type: Double-Electric Breast Pump Pump Education: Setup, frequency, and cleaning;Milk Storage Reason for Pumping: pre-term NICU infant Pumping frequency: 8 times/24 hours Pumped volume: 70 mL (70-110 ml)  Interventions Interventions: Breast feeding basics reviewed;DEBP;Education  Discharge Pump: DEBP;Personal (Spectra DEBP at home)  Consult Status Consult Status: Follow-up Follow-up type: In-patient    Vicki Hooper Vicki Hooper 07/02/2021, 6:41 PM

## 2021-07-14 ENCOUNTER — Ambulatory Visit: Payer: Self-pay

## 2021-07-14 NOTE — Lactation Note (Signed)
This note was copied from a baby's chart. Lactation Consultation Note LC has made numerous attempts to consult with mother in NICU this week. Today, I called and spoke with mom by phone. She continues to pump frequently and her milk supply is wnl. She is aware of LC services; no concerns at this time. Will plan f/u next week.   Patient Name: Vicki Hooper IAXKP'V Date: 07/14/2021 Reason for consult: Follow-up assessment (phone consult) Age:27 wk.o.  Maternal Data  Pumping frequency: q3 per pumping  Feeding Mother's Current Feeding Choice: Breast Milk  Interventions Interventions: Education  Consult Status Consult Status: Follow-up Follow-up type: In-patient   [name], MA IBCLC 07/14/2021, 6:02 PM

## 2021-07-16 ENCOUNTER — Ambulatory Visit: Payer: Self-pay

## 2021-07-16 NOTE — Lactation Note (Signed)
This note was copied from a baby's chart. Lactation Consultation Note Mother continues to pump frequently and with normal milk volume.  Patient Name: Vicki Hooper EXMDY'J Date: 07/16/2021 Reason for consult: Follow-up assessment Age:27 wk.o.  Maternal Data  Pumping frequency: q 2-3 115-125 per pumping  Feeding Mother's Current Feeding Choice: Breast Milk   Interventions Interventions: Education IDF  Consult Status Consult Status: Follow-up Follow-up type: In-patient   [name], MA IBCLC 07/16/2021, 6:00 PM

## 2021-07-29 ENCOUNTER — Ambulatory Visit: Payer: Self-pay

## 2021-07-29 NOTE — Lactation Note (Signed)
This note was copied from a baby's chart. Lactation Consultation Note  Patient Name: Vicki Hooper [id number]'M Date: 07/29/2021 Reason for consult: Follow-up assessment;NICU baby;Infant < 6lbs;Primapara;1st time breastfeeding;Preterm <34wks Age:27 wk.o.  Visited with mom of 75 59/38 weeks old pre-term female, she's a P1 and reported her period came on 07/26/2021. A few days prior she experienced a drastic drop in her supply and she's now taking Reglan (started 2 days ago) she voiced she's already seeing her supply coming back very slowly. Advised mom to continue pumping consistently and to complete the 14 day course prescribed of Reglan.  She also voiced some discomfort when pumping on her right breast, unable to examine because mom was on her way out of the hospital but she said she noticed that the flange is now too big on that side, she already ordered flanges # 21 for her Spectra pump at home, [facility] brought a # 21 flange for hospital use. Reviewed lactogenesis III and the effect of hormones; she'll also be starting birth control (progestin only).  Plan of care   Mom will continue pumping every 2-3 hours, at least 8 pumping sessions/24 hours She'll power pump in the AM if she misses a pumping session during the night Will finish up her Rx for Reglan for 14 days   FOB present. Mom reported all questions and concerns were answered, she's aware of LC NICU services and will call PRN  Maternal Data   Mom's supply is BNL  Feeding Mother's Current Feeding Choice: Breast Milk  Lactation Tools Discussed/Used Tools: Pump;Flanges Flange Size: 21;24 (R = 21; L = 24) Breast pump type: Double-Electric Breast Pump Pump Education: Setup, frequency, and cleaning;Milk Storage Reason for Pumping: pre-term NICU infant Pumping frequency: q 3 hours Pumped volume: 70 mL (mom voiced she was pumping about 125-150 ml but her supply drastically dropped right before she got her  period)  Interventions Interventions: Breast feeding basics reviewed;DEBP;Education  Discharge Pump: DEBP;Personal (Spectra DEBP at home)  Consult Status Consult Status: Follow-up Follow-up type: In-patient   Vicki Hooper Vicki Hooper 07/29/2021, 4:44 PM

## 2021-08-02 ENCOUNTER — Ambulatory Visit: Payer: Self-pay

## 2021-08-02 NOTE — Lactation Note (Signed)
This note was copied from a baby's chart. Lactation Consultation Note Mother continues to pump frequently and with normal milk supply. Will plan f/u next week.   Patient Name: Vicki Hooper [id number]'V Date: 08/02/2021 Reason for consult: Follow-up assessment Age:27 wk.o.  Maternal Data  Pumping frequency: 8xday Pumped volume: 100 mL  Feeding Mother's Current Feeding Choice: Breast Milk   Consult Status Consult Status: Follow-up Date: 08/02/21 Follow-up type: In-patient   [name], MA IBCLC 08/02/2021, 5:32 PM

## 2021-08-12 ENCOUNTER — Ambulatory Visit: Payer: Self-pay

## 2021-08-12 NOTE — Lactation Note (Signed)
This note was copied from a baby's chart. Lactation Consultation Note  Patient Name: Vicki Hooper Vicki Hooper Date: 08/12/2021 Reason for consult: Follow-up assessment;Primapara;1st time breastfeeding;NICU baby;Preterm <34wks;Infant < 6lbs Age:27 wk.o.  Visited with mom of 52 8/70 weeks old (adjusted) NICU female, she's a P1 and has been working on power pumping this time and getting her supply almost back to normal.  She's very happy to report that baby is now on an O2 canula instead of having full O2 support, mom has been doing hours of STS care during the evening when she's been coming to visit baby.  Reviewed benefits of STS care, pumping schedule, tips of power pumping and hydration.  Plan of care:  Mom will continue pumping every 2-3 hours, at least 8 pumping sessions/24 hours She'll power pump in the AM if she misses a pumping session during the night She'll increase her water intake to 3 liters/day   GOB (maternal) present and very supportive. Family reported all questions and concerns were answered, they're aware of LC NICU services and will call PRN   Maternal Data   Mom's milk supply is almost back to normal and WNL. There are some "peak" variations throughout the day that may relate to dehydration  Feeding Mother's Current Feeding Choice: Breast Milk  Lactation Tools Discussed/Used Tools: Pump;Flanges Flange Size: 21;24 (R=21 L=24) Breast pump type: Double-Electric Breast Pump Pump Education: Setup, frequency, and cleaning;Milk Storage Reason for Pumping: pre-term NICU infant Pumping frequency: 7-8 times/24 hours Pumped volume: 90 mL (20-160 ml)  Interventions Interventions: Breast feeding basics reviewed;DEBP;Education  Discharge Pump: DEBP;Personal (Spectra DEBP at home)  Consult Status Consult Status: Follow-up Date: 08/12/21 Follow-up type: In-patient  Vicki Hooper Vicki Hooper 08/12/2021, 3:00 PM

## 2021-08-21 ENCOUNTER — Ambulatory Visit: Payer: Self-pay

## 2021-08-21 NOTE — Lactation Note (Signed)
This note was copied from a baby's chart. Lactation Consultation Note LC assisted with positioning for lick & learn bf'ing. Baby did not wake. Reviewed expectations and IDF plan. Will return Wednesday at 1200 to re-challenge. Mother has normal milk supply. She will prepump before bf trial.   Patient Name: Vicki Hooper [id number] Date: 08/21/2021 Reason for consult: Follow-up assessment (lick & learn) Age:27 m.o.  Maternal Data  8xday  Feeding Mother's Current Feeding Choice: Breast Milk   Interventions Interventions: Education  Consult Status Consult Status: Follow-up Date: 08/21/21 Follow-up type: In-patient   [name] 08/21/2021, 4:18 PM

## 2021-08-24 ENCOUNTER — Ambulatory Visit: Payer: Self-pay

## 2021-08-24 NOTE — Lactation Note (Addendum)
This note was copied from a baby's chart. Lactation Consultation Note  Patient Name: Girl Silveria Botz [id number] Date: 08/24/2021 Reason for consult: Follow-up assessment;NICU baby;Preterm <34wks Age:27 m.o.  Lactation follow up for lick and learn. Ms. Mack was pre-pumping upon entry. Her pumping volumes and frequency are appropriate. She has returned to work, but she has been provided with a space to pump, and her managers allow her to take pumping breaks. I reviewed best practices in pumping. We placed baby Zoey in cross cradle hold. Baby showed rooting cues at the breast and strong interest in licking EBM off the nipple and opening wide to accept the nipple. Baby showed some small suckling bursts. We discontinued the attempt when oxygen levels dipped briefly into 80s. Education provided at the bedside about the role of lick and learn.   Mom correlates some of her lower producing pumping sessions to water and nutritional intake that day. We discussed milk volume maintenance levels.  Belly band provided. Mom was using her hands to hold flanges in place. We also discussed using a sports bra to hold flanges in place.   Feeding Mother's Current Feeding Choice: Breast Milk  Lactation Tools Discussed/Used Breast pump type: Double-Electric Breast Pump Pump Education: Setup, frequency, and cleaning Reason for Pumping: NICU; separation Pumping frequency: 6-8 times a day Pumped volume: 100 mL (70-120)  Interventions  Positioning at the breast; education  Discharge Pump: DEBP;Personal (Spectra)  Consult Status Consult Status: Follow-up Date: 08/24/21 Follow-up type: In-patient    Walker Shadow 08/24/2021, 12:31 PM

## 2021-09-01 ENCOUNTER — Ambulatory Visit: Payer: Self-pay

## 2021-09-01 NOTE — Lactation Note (Signed)
This note was copied from a baby's chart. Lactation Consultation Note  Patient Name: Vicki Hooper [id number]'H Date: 09/01/2021 Reason for consult: NICU baby;Late-preterm 34-36.6wks;Weekly NICU follow-up;1st time breastfeeding;Primapara Age:27 m.o.  Visited with mom of 68 9/60 weeks old LPI NICU; she's a P1 and reports a dip on her supply. Mom has also gone back to work a couple of weeks ago but she voices her employer [employer]) is very supportive with her pumping.   Reviewed strategies to increase her supply, lick and learn, mom has been taking baby to breast on her own, initially to fully pumped breasts and now she's starting offering them about an hour after she pumps.  Plan of care:  Mom will continue pumping every 2-3 hours, at least 8 pumping sessions/24 hours She'll power pump in the AM if she misses a pumping session during the night She'll continue taking baby "Zoey" to breast for some lick and learn   No other support person at this time. Mom reported all questions and concerns were answered, she's aware of LC NICU services and will call PRN  Maternal Data   Mom's supply is BNL probably due to her menses  Feeding Mother's Current Feeding Choice: Breast Milk  Lactation Tools Discussed/Used Tools: Pump Breast pump type: Double-Electric Breast Pump Pump Education: Setup, frequency, and cleaning;Milk Storage Reason for Pumping: LPI in NICU Pumping frequency: 8 times/24 hours Pumped volume: 50 mL (50-90 ml)  Consult Status Consult Status: Follow-up Date: 09/01/21 Follow-up type: In-patient   Sahira Cataldi [name] 09/01/2021, 11:54 AM

## 2021-09-06 ENCOUNTER — Ambulatory Visit: Payer: Self-pay

## 2021-09-06 NOTE — Lactation Note (Signed)
This note was copied from a baby's chart. Lactation Consultation Note Mother continues to pump frequently and with normal milk volume. She is advancing lick and learn. LC to return tomorrow for 12pm feeding to observe/assist.   Patient Name: Vicki Hooper [id number] Date: 09/06/2021 Reason for consult: Follow-up assessment Age:27 m.o.  Maternal Data  Pumping frequency: q 3 70-169mL per pumping  Feeding Mother's Current Feeding Choice: Breast Milk   Interventions Interventions: Education   Consult Status Consult Status: Follow-up Date: 09/06/21   [name] 09/06/2021, 3:46 PM

## 2021-09-07 ENCOUNTER — Ambulatory Visit: Payer: Self-pay

## 2021-09-07 NOTE — Lactation Note (Signed)
This note was copied from a baby's chart. Lactation Consultation Note  Patient Name: [name] [id number] Date: 09/07/2021 Reason for consult: Follow-up assessment;Primapara;NICU baby;1st time breastfeeding;Preterm <34wks Age:27 m.o.  Lactation followed up with Ms. Ellerbe for latch attempt on an unpumped breast. Baby Vicki Hooper briefly suckled a few times intermittently in a somewhat shallow latch, but overall impression was that she was too sleepy to sustain her latch even with gentle stimulation. I encouraged Ms. Bills to hand express some of her milk while Vicki Hooper was at the breast and to allow baby to do some lick and learn.  Ms. Brzozowski and I briefly reviewed her pumping schedule for home and work and discussed ways to help maintain her breast milk supply. I encouraged her to continue to allow baby to practice latching during visitation, and lactation will continue to monitor their progress and provide assistance.   Maternal Data Has patient been taught Hand Expression?: Yes Does the patient have breastfeeding experience prior to this delivery?: No  Feeding Mother's Current Feeding Choice: Breast Milk  LATCH Score Latch: Repeated attempts needed to sustain latch, nipple held in mouth throughout feeding, stimulation needed to elicit sucking reflex.  Audible Swallowing: None  Type of Nipple: Everted at rest and after stimulation  Comfort (Breast/Nipple): Soft / non-tender  Hold (Positioning): Assistance needed to correctly position infant at breast and maintain latch.  LATCH Score: 6   Lactation Tools Discussed/Used Breast pump type: Double-Electric Breast Pump Pump Education: Setup, frequency, and cleaning Reason for Pumping: nicu; separation Pumping frequency: q 3 hours; q 4 hours during shift (3rd shift) Pumped volume:  (70-120 mls)  Interventions Interventions: Breast feeding basics reviewed;Assisted with latch;Hand express;Breast compression;Support  pillows;Education  Discharge Pump: DEBP;Personal (Has: Spectra; a "bra" pump (hands free); and used Medela)  Consult Status Consult Status: Follow-up Date: 09/07/21 Follow-up type: In-patient    Walker Shadow 09/07/2021, 12:32 PM

## 2021-09-13 ENCOUNTER — Ambulatory Visit: Payer: Self-pay

## 2021-09-13 NOTE — Lactation Note (Signed)
This note was copied from a baby's chart. Lactation Consultation Note Mother with decreased but fluctuating milk supply. She continues to pump frequently. SLP present for bottle feeding. Mother aware of LC support prn.   Patient Name: Vicki Hooper Date: 09/13/2021 Reason for consult: Follow-up assessment Age:27 m.o.  Maternal Data  Pumping frequency: 7x Pumped volume: 30 mL  Feeding Mother's Current Feeding Choice: Breast Milk   Consult Status Consult Status: Follow-up Date: 09/13/21 Follow-up type: In-patient   [name] 09/13/2021, 11:47 AM

## 2021-09-23 ENCOUNTER — Ambulatory Visit: Payer: Self-pay

## 2021-09-23 NOTE — Lactation Note (Signed)
This note was copied from a baby's chart. Lactation Consultation Note Mother is no longer pumping. Lactation services are complete.  Patient Name: Girl Marieke Lubke [id number] Date: 09/23/2021   Age:27 m.o.   [name] 09/23/2021, 3:39 PM

## 2021-11-08 ENCOUNTER — Telehealth: Payer: Self-pay

## 2021-11-08 NOTE — Telephone Encounter (Signed)
Transition Care Management Follow-up Telephone Call Date of discharge and from where: 11/07/2021 from [facility] How have you been since you were released from the hospital? Pt stated that she is feeling well. Pt did not have any questions at this time.  Any questions or concerns? No  Items Reviewed: Did the pt receive and understand the discharge instructions provided? Yes  Medications obtained and verified? Yes  Other? No  Any new allergies since your discharge? No  Dietary orders reviewed? No Do you have support at home? Yes   Functional Questionnaire: (I = Independent and D = Dependent) ADLs: I Bathing/Dressing- I Meal Prep- I Eating- I Maintaining continence- I Transferring/Ambulation- I Managing Meds- I  Follow up appointments reviewed: PCP Hospital f/u appt confirmed? No   Specialist Hospital f/u appt confirmed? No   Are transportation arrangements needed? No  If their condition worsens, is the pt aware to call PCP or go to the Emergency Dept.? Yes Was the patient provided with contact information for the PCP's office or ED? Yes Was to pt encouraged to call back with questions or concerns? Yes

## 2022-01-01 ENCOUNTER — Telehealth: Payer: Self-pay

## 2022-01-01 NOTE — Telephone Encounter (Signed)
Transition Care Management Unsuccessful Follow-up Telephone Call  Date of discharge and from where:  12/30/2020 from [facility]  Attempts:  1st Attempt  Reason for unsuccessful TCM follow-up call:  Left voice message   .

## 2022-01-02 NOTE — Telephone Encounter (Signed)
Transition Care Management Follow-up Telephone Call Date of discharge and from where: 12/30/2021 from [facility] How have you been since you were released from the hospital? Pt stated that she is feeling much better.  Any questions or concerns? No  Items Reviewed: Did the pt receive and understand the discharge instructions provided? Yes  Medications obtained and verified? Yes  Other? No  Any new allergies since your discharge? No  Dietary orders reviewed? No Do you have support at home? Yes   Functional Questionnaire: (I = Independent and D = Dependent) ADLs: I Bathing/Dressing- I Meal Prep- I Eating- I Maintaining continence- I Transferring/Ambulation- I Managing Meds- I   Follow up appointments reviewed: PCP Hospital f/u appt confirmed? No  Pt is calling to establish care with Yolanda Manges.  Specialist Hospital f/u appt confirmed? Yes  Scheduled to see General Surgeon after they call pt to schedule follow up appt.  Are transportation arrangements needed? No  If their condition worsens, is the pt aware to call PCP or go to the Emergency Dept.? Yes Was the patient provided with contact information for the PCP's office or ED? Yes Was to pt encouraged to call back with questions or concerns? Yes

## 2022-10-19 LAB — OB RESULTS CONSOLE HIV ANTIBODY (ROUTINE TESTING): HIV: NONREACTIVE

## 2022-10-19 LAB — HEPATITIS C ANTIBODY: HCV Ab: NEGATIVE

## 2022-10-19 LAB — OB RESULTS CONSOLE RUBELLA ANTIBODY, IGM: Rubella: IMMUNE

## 2022-10-19 LAB — OB RESULTS CONSOLE RPR: RPR: NONREACTIVE

## 2022-10-19 LAB — OB RESULTS CONSOLE HEPATITIS B SURFACE ANTIGEN: Hepatitis B Surface Ag: NEGATIVE

## 2022-11-20 ENCOUNTER — Encounter (HOSPITAL_COMMUNITY): Payer: Self-pay | Admitting: *Deleted

## 2022-11-20 ENCOUNTER — Telehealth (HOSPITAL_COMMUNITY): Payer: Self-pay | Admitting: *Deleted

## 2022-11-20 NOTE — Telephone Encounter (Signed)
Preadmission screen  

## 2022-11-23 ENCOUNTER — Inpatient Hospital Stay (HOSPITAL_COMMUNITY): Payer: BC Managed Care – PPO | Admitting: Anesthesiology

## 2022-11-23 ENCOUNTER — Other Ambulatory Visit: Payer: Self-pay

## 2022-11-23 ENCOUNTER — Encounter (HOSPITAL_COMMUNITY)
Admission: RE | Disposition: A | Discharge status: Home / Self Care | Payer: Self-pay | Source: Home / Self Care | Attending: Obstetrics and Gynecology

## 2022-11-23 ENCOUNTER — Observation Stay (HOSPITAL_COMMUNITY)
Admission: RE | Admit: 2022-11-23 | Discharge: 2022-11-23 | Disposition: A | Discharge status: Home / Self Care | Payer: BC Managed Care – PPO | Attending: Obstetrics and Gynecology | Admitting: Obstetrics and Gynecology

## 2022-11-23 ENCOUNTER — Encounter (HOSPITAL_COMMUNITY): Payer: Self-pay | Admitting: Obstetrics and Gynecology

## 2022-11-23 DIAGNOSIS — Z87891 Personal history of nicotine dependence: Secondary | ICD-10-CM | POA: Insufficient documentation

## 2022-11-23 DIAGNOSIS — O3431 Maternal care for cervical incompetence, first trimester: Principal | ICD-10-CM | POA: Insufficient documentation

## 2022-11-23 DIAGNOSIS — N883 Incompetence of cervix uteri: Secondary | ICD-10-CM | POA: Diagnosis not present

## 2022-11-23 HISTORY — PX: CERVICAL CERCLAGE: SHX1329

## 2022-11-23 LAB — CBC
HCT: 39 % (ref 36.0–46.0)
Hemoglobin: 12.8 g/dL (ref 12.0–15.0)
MCH: 30.9 pg (ref 26.0–34.0)
MCHC: 32.8 g/dL (ref 30.0–36.0)
MCV: 94.2 fL (ref 80.0–100.0)
Platelets: 211 10*3/uL (ref 150–400)
RBC: 4.14 MIL/uL (ref 3.87–5.11)
RDW: 13.6 % (ref 11.5–15.5)
WBC: 11.4 10*3/uL — ABNORMAL HIGH (ref 4.0–10.5)
nRBC: 0 % (ref 0.0–0.2)

## 2022-11-23 LAB — TYPE AND SCREEN
ABO/RH(D): O POS
Antibody Screen: NEGATIVE

## 2022-11-23 SURGERY — CERCLAGE, CERVIX, VAGINAL APPROACH
Anesthesia: Spinal

## 2022-11-23 MED ORDER — SOD CITRATE-CITRIC ACID 500-334 MG/5ML PO SOLN
ORAL | Status: AC
  Filled 2022-11-23: qty 30

## 2022-11-23 MED ORDER — ACETAMINOPHEN 500 MG PO TABS
ORAL_TABLET | ORAL | Status: AC
  Filled 2022-11-23: qty 2

## 2022-11-23 MED ORDER — STERILE WATER FOR IRRIGATION IR SOLN
Status: DC | PRN
  Administered 2022-11-23: 1000 mL

## 2022-11-23 MED ORDER — LACTATED RINGERS IV SOLN: INTRAVENOUS | Status: DC

## 2022-11-23 MED ORDER — ACETAMINOPHEN 500 MG PO TABS
1000.0000 mg | ORAL_TABLET | ORAL | Status: AC
  Administered 2022-11-23: 1000 mg via ORAL

## 2022-11-23 MED ORDER — ONDANSETRON HCL 4 MG/2ML IJ SOLN: INTRAMUSCULAR | Status: DC | PRN

## 2022-11-23 MED ORDER — ONDANSETRON HCL 4 MG/2ML IJ SOLN
4.0000 mg | Freq: Once | INTRAMUSCULAR | Status: AC
  Administered 2022-11-23: 4 mg via INTRAVENOUS

## 2022-11-23 MED ORDER — LIDOCAINE-EPINEPHRINE (PF) 2 %-1:200000 IJ SOLN
INTRAMUSCULAR | Status: AC
  Filled 2022-11-23: qty 10

## 2022-11-23 MED ORDER — ACETAMINOPHEN 500 MG PO TABS: 1000.0000 mg | ORAL_TABLET | Freq: Four times a day (QID) | ORAL | 0 refills | Status: DC | PRN

## 2022-11-23 MED ORDER — ONDANSETRON HCL 4 MG/2ML IJ SOLN
INTRAMUSCULAR | Status: AC
  Filled 2022-11-23: qty 2

## 2022-11-23 MED ORDER — SODIUM CHLORIDE 0.9 % IV SOLN
25.0000 mg | Freq: Once | INTRAVENOUS | Status: AC | PRN
  Administered 2022-11-23: 25 mg via INTRAVENOUS
  Filled 2022-11-23: qty 1

## 2022-11-23 MED ORDER — SODIUM CHLORIDE 0.9 % IV SOLN: 25.0000 mg | Freq: Once | INTRAVENOUS | Status: DC

## 2022-11-23 MED ORDER — POVIDONE-IODINE 10 % EX SWAB
2.0000 | Freq: Once | CUTANEOUS | Status: AC
  Administered 2022-11-23: 2 via TOPICAL

## 2022-11-23 MED ORDER — FENTANYL CITRATE (PF) 100 MCG/2ML IJ SOLN: 25.0000 ug | INTRAMUSCULAR | Status: DC | PRN

## 2022-11-23 MED ORDER — ONDANSETRON HCL 4 MG/2ML IJ SOLN
INTRAMUSCULAR | Status: DC | PRN
  Administered 2022-11-23: 4 mg via INTRAVENOUS

## 2022-11-23 MED ORDER — LIDOCAINE HCL (PF) 1 % IJ SOLN
INTRAMUSCULAR | Status: AC
  Filled 2022-11-23: qty 30

## 2022-11-23 MED ORDER — ONDANSETRON HCL 4 MG PO TABS: 4.0000 mg | ORAL_TABLET | Freq: Four times a day (QID) | ORAL | Status: DC | PRN

## 2022-11-23 MED ORDER — ONDANSETRON HCL 4 MG/2ML IJ SOLN: 4.0000 mg | Freq: Four times a day (QID) | INTRAMUSCULAR | Status: DC | PRN

## 2022-11-23 MED ORDER — SOD CITRATE-CITRIC ACID 500-334 MG/5ML PO SOLN
30.0000 mL | ORAL | Status: AC
  Administered 2022-11-23: 30 mL via ORAL

## 2022-11-23 MED ORDER — CHLOROPROCAINE HCL 50 MG/5ML IT SOLN
5.0000 mL | Freq: Once | INTRATHECAL | Status: DC
  Filled 2022-11-23: qty 5

## 2022-11-23 MED ORDER — PROMETHAZINE HCL 25 MG/ML IJ SOLN: 6.2500 mg | INTRAMUSCULAR | Status: DC | PRN

## 2022-11-23 MED ORDER — BUPIVACAINE IN DEXTROSE 0.75-8.25 % IT SOLN
INTRATHECAL | Status: DC | PRN
  Administered 2022-11-23: 1.2 mL via INTRATHECAL

## 2022-11-23 MED ORDER — OXYCODONE HCL 5 MG PO TABS: 5.0000 mg | ORAL_TABLET | Freq: Once | ORAL | Status: DC | PRN

## 2022-11-23 MED ORDER — ACETAMINOPHEN 500 MG PO TABS: 1000.0000 mg | ORAL_TABLET | Freq: Four times a day (QID) | ORAL | Status: DC

## 2022-11-23 MED ORDER — AMISULPRIDE (ANTIEMETIC) 5 MG/2ML IV SOLN: 10.0000 mg | Freq: Once | INTRAVENOUS | Status: DC | PRN

## 2022-11-23 MED ORDER — LIDOCAINE-EPINEPHRINE (PF) 2 %-1:200000 IJ SOLN
INTRAMUSCULAR | Status: DC | PRN
  Administered 2022-11-23: 10 mL via INTRADERMAL

## 2022-11-23 MED ORDER — OXYCODONE HCL 5 MG PO TABS: 5.0000 mg | ORAL_TABLET | ORAL | Status: DC | PRN

## 2022-11-23 MED ORDER — OXYCODONE HCL 5 MG/5ML PO SOLN: 5.0000 mg | Freq: Once | ORAL | Status: DC | PRN

## 2022-11-23 SURGICAL SUPPLY — 16 items
CANISTER SUCT 3000ML PPV (MISCELLANEOUS) ×1 IMPLANT
GAUZE 4X4 16PLY ~~LOC~~+RFID DBL (SPONGE) IMPLANT
GLOVE BIO SURGEON STRL SZ 6.5 (GLOVE) ×2 IMPLANT
GLOVE BIOGEL PI IND STRL 7.0 (GLOVE) ×1 IMPLANT
GOWN STRL REUS W/TWL LRG LVL3 (GOWN DISPOSABLE) ×2 IMPLANT
NDL MAYO CATGUT SZ4 TPR NDL (NEEDLE) ×1 IMPLANT
NEEDLE MAYO CATGUT SZ4 (NEEDLE) ×1 IMPLANT
PACK VAGINAL MINOR WOMEN LF (CUSTOM PROCEDURE TRAY) ×1 IMPLANT
PAD OB MATERNITY 4.3X12.25 (PERSONAL CARE ITEMS) ×1 IMPLANT
PAD PREP 24X48 CUFFED NSTRL (MISCELLANEOUS) ×1 IMPLANT
SUT POLYDEK 5 CE 75 36 (SUTURE) ×2 IMPLANT
SYR BULB IRRIGATION 50ML (SYRINGE) ×1 IMPLANT
TOWEL OR 17X24 6PK STRL BLUE (TOWEL DISPOSABLE) ×2 IMPLANT
TRAY FOLEY W/BAG SLVR 14FR (SET/KITS/TRAYS/PACK) ×1 IMPLANT
TUBING NON-CON 1/4 X 20 CONN (TUBING) ×1 IMPLANT
YANKAUER SUCT BULB TIP NO VENT (SUCTIONS) ×1 IMPLANT

## 2022-11-23 NOTE — Anesthesia Postprocedure Evaluation (Signed)
Anesthesia Post Note  Patient: Vicki Hooper  Procedure(s) Performed: CERCLAGE CERVICAL     Patient location during evaluation: PACU Anesthesia Type: Spinal Level of consciousness: awake and alert Pain management: pain level controlled Vital Signs Assessment: post-procedure vital signs reviewed and stable Respiratory status: spontaneous breathing, nonlabored ventilation and respiratory function stable Cardiovascular status: blood pressure returned to baseline and stable Postop Assessment: no apparent nausea or vomiting Anesthetic complications: no   No notable events documented.  Last Vitals:  Vitals:   11/23/22 1800 11/23/22 1815  BP: 109/69 103/71  Pulse: (!) 59 63  Resp: 13 17  Temp:  36.9 C  SpO2: 97% 97%    Last Pain:  Vitals:   11/23/22 1815  TempSrc:   PainSc: 0-No pain   Pain Goal:    LLE Motor Response: Purposeful movement (11/23/22 1815)   RLE Motor Response: Purposeful movement (11/23/22 1815)       Epidural/Spinal Function Cutaneous sensation: Able to Wiggle Toes (11/23/22 1815), Patient able to flex knees: Yes (11/23/22 1815), Patient able to lift hips off bed: Yes (11/23/22 1815), Back pain beyond tenderness at insertion site: No (11/23/22 1815), Progressively worsening motor and/or sensory loss: No (11/23/22 1815), Bowel and/or bladder incontinence post epidural: No (11/23/22 1815)  Lowella Curb

## 2022-11-23 NOTE — Anesthesia Preprocedure Evaluation (Addendum)
Anesthesia Evaluation  Patient identified by MRN, date of birth, ID band Patient awake    Reviewed: Allergy & Precautions, NPO status , Patient's Chart, lab work & pertinent test results  Airway Mallampati: II  TM Distance: >3 FB Neck ROM: Full    Dental no notable dental hx.    Pulmonary former smoker   Pulmonary exam normal        Cardiovascular negative cardio ROS Normal cardiovascular exam     Neuro/Psych negative neurological ROS  negative psych ROS   GI/Hepatic negative GI ROS, Neg liver ROS,,,  Endo/Other  negative endocrine ROS    Renal/GU negative Renal ROS     Musculoskeletal negative musculoskeletal ROS (+)    Abdominal   Peds  Hematology negative hematology ROS (+)   Anesthesia Other Findings incomplete cervix  Reproductive/Obstetrics                             Anesthesia Physical Anesthesia Plan  ASA: 2  Anesthesia Plan: Spinal   Post-op Pain Management:    Induction: Intravenous  PONV Risk Score and Plan: 2 and Treatment may vary due to age or medical condition  Airway Management Planned: Natural Airway  Additional Equipment:   Intra-op Plan:   Post-operative Plan:   Informed Consent: I have reviewed the patients History and Physical, chart, labs and discussed the procedure including the risks, benefits and alternatives for the proposed anesthesia with the patient or authorized representative who has indicated his/her understanding and acceptance.     Dental advisory given  Plan Discussed with: CRNA  Anesthesia Plan Comments:        Anesthesia Quick Evaluation

## 2022-11-23 NOTE — Op Note (Signed)
Operative Note    Preoperative Diagnosis Incompetent cervix 2. History of preterm delivery   Postoperative Diagnosis: Same    Procedure: Cervical cerclage - [place]    Surgeon: Britt Bottom DO   Anesthesia: Spinal and local ( lidocaine 2% with epinephrine)  Fluids: LR EBL: 54ml UOP:   Findings: Cervix approx 2.5cm in length; os closed    Specimen: n/a   Procedure Note  Pt was taken to OR and spinal anesthesia administered without complications.   Pt was placed in the dorsal lithotomy position and appropriate time out done. Pt was prepped with betadine, foley catheter placed for bladder decompression and pt draped in sterile fashion.   Exam under anesthesia confirmed cervix still closed.  A weighted speculum placed posteriorly and a lateral sims anteriorly. Local anesthesia with 2% lidocaine with epinephrine was at, 3 and 9 o'clock.  0 mersilene suture was then placed circumferentially with traction provided using a ring forceps. Two loops were done with the first knot placed at 1 o'clock and the second at [facility].   No active bleeding noted at this time.  Thus, all instruments were then removed from her vagina. Counts correct.  Pt to recovery room in stable condition.  She tolerated the procedure well

## 2022-11-23 NOTE — Anesthesia Procedure Notes (Signed)
Spinal  Patient location during procedure: OR Start time: 11/23/2022 3:10 PM End time: 11/23/2022 3:16 PM Reason for block: surgical anesthesia Staffing Performed: anesthesiologist  Anesthesiologist: Leonides Grills, MD Performed by: Leonides Grills, MD Authorized by: Leonides Grills, MD   Preanesthetic Checklist Completed: patient identified, IV checked, risks and benefits discussed, surgical consent, monitors and equipment checked, pre-op evaluation and timeout performed Spinal Block Patient position: sitting Prep: DuraPrep Patient monitoring: cardiac monitor, continuous pulse ox and blood pressure Approach: midline Location: L4-5 Injection technique: single-shot Needle Needle type: Pencan  Needle gauge: 24 G Needle length: 9 cm Assessment Sensory level: T10 Events: CSF return Additional Notes Functioning IV was confirmed and monitors were applied. Sterile prep and drape, including hand hygiene and sterile gloves were used. The patient was positioned and the spine was prepped. The skin was anesthetized with lidocaine.  Free flow of clear CSF was obtained prior to injecting local anesthetic into the CSF.  The spinal needle aspirated freely following injection.  The needle was carefully withdrawn.  The patient tolerated the procedure well.

## 2022-11-23 NOTE — Discharge Instructions (Signed)
Call office with any concerns [phone] 

## 2022-11-23 NOTE — Progress Notes (Signed)
Patient was able to stand and ambulate around the room with no problems. No nausea or vomiting, no dizziness. Patient voided before leaving the room. All discharge instructions were gone over with RN.

## 2022-11-23 NOTE — H&P (Addendum)
Vicki Hooper is an 26 y.W.[id number] female here for cervical cerclage at 31 5/[redacted]wks gestation. Pt is dated per 7week [place]. She has a history of 2 miscarriages and PPROM with PTL at 25 weeks.   She reports no complaints today other than nausea form not eating.   Pertinent Gynecological History: Menses:  n/a pregnant Bleeding: n/a Contraception:  n/a DES exposure: denies Blood transfusions: none Sexually transmitted diseases: no past history Previous GYN Procedures:  cervical cerclage   OB History: G4, P1021   Menstrual History: Menarche age: 26 No LMP recorded. Patient is pregnant.    History reviewed. No pertinent past medical history.  Past Surgical History:  Procedure Laterality Date   APPENDECTOMY     MOUTH SURGERY     WRIST SURGERY Left     Family History  Problem Relation Age of Onset   Hypertension Mother    Diabetes Maternal Grandfather    Hypertension Maternal Grandfather    Diabetes Paternal Grandfather    Diabetes Sister     Social History:  reports that she quit smoking about 6 years ago. Her smoking use included cigarettes. She has a 0.75 pack-year smoking history. She has never used smokeless tobacco. She reports that she does not drink alcohol and does not use drugs.  Allergies: No Known Allergies  Medications Prior to Admission  Medication Sig Dispense Refill Last Dose   acetaminophen (TYLENOL) 500 MG tablet Take 500-1,000 mg by mouth every 6 (six) hours as needed (pain.).      aspirin EC 81 MG tablet Take 81 mg by mouth daily at 4 PM. Swallow whole.      Prenatal Vit-Fe Fumarate-FA (MULTIVITAMIN-PRENATAL) 27-0.8 MG TABS tablet Take 1 tablet by mouth daily at 4 PM.      PREVIDENT 5000 PLUS 1.1 % CREA dental cream Place 1 Application onto teeth at bedtime.      progesterone 200 MG SUPP Place 200 mg vaginally at bedtime.       Review of Systems  Constitutional:  Negative for activity change and fatigue.  Eyes:  Negative for photophobia and visual  disturbance.  Respiratory:  Negative for chest tightness and shortness of breath.   Cardiovascular:  Negative for chest pain, palpitations and leg swelling.  Gastrointestinal:  Negative for abdominal pain.  Genitourinary:  Negative for pelvic pain and vaginal bleeding.  Musculoskeletal:  Negative for back pain.  Neurological:  Negative for light-headedness and headaches.  Psychiatric/Behavioral:  The patient is nervous/anxious.     Blood pressure 109/68, pulse 74, temperature 98.4 F (36.9 C), temperature source Oral, resp. rate 18, height 5\' 7"  (1.702 m), weight 64.4 kg, SpO2 100 %, unknown if currently breastfeeding. Physical Exam Vitals and nursing note reviewed. Exam conducted with a chaperone present.  Constitutional:      Appearance: Normal appearance. She is normal weight.  Pulmonary:     Effort: Pulmonary effort is normal.  Abdominal:     Palpations: Abdomen is soft.  Genitourinary:    General: Normal vulva.  Musculoskeletal:        General: Normal range of motion.     Cervical back: Normal range of motion.  Skin:    General: Skin is dry.     Capillary Refill: Capillary refill takes 2 to 3 seconds.  Neurological:     General: No focal deficit present.     Mental Status: She is alert and oriented to person, place, and time. Mental status is at baseline.  Psychiatric:  Mood and Affect: Mood normal.        Behavior: Behavior normal.        Thought Content: Thought content normal.        Judgment: Judgment normal.     Results for orders placed or performed during the hospital encounter of 11/23/22 (from the past 24 hour(s))  Type and screen [facility]     Status: None   Collection Time: 11/23/22 10:13 AM  Result Value Ref Range   ABO/RH(D) O POS    Antibody Screen NEG    Sample Expiration      11/26/2022,2359 Performed at [facility], [address] [place], [place] [address]   CBC     Status: Abnormal   Collection Time: 11/23/22  10:14 AM  Result Value Ref Range   WBC 11.4 (H) 4.0 - 10.5 K/uL   RBC 4.14 3.87 - 5.11 MIL/uL   Hemoglobin 12.8 12.0 - 15.0 g/dL   HCT 72.5 36.6 - 44.0 %   MCV 94.2 80.0 - 100.0 fL   MCH 30.9 26.0 - 34.0 pg   MCHC 32.8 30.0 - 36.0 g/dL   RDW 34.7 42.5 - 95.6 %   Platelets 211 150 - 400 K/uL   nRBC 0.0 0.0 - 0.2 %    No results found.  Assessment/Plan: 38VF [id number] female here for cervical cerclage placement at 8 5/[redacted]wks gestation - Admit for procedure - ERAS protocol - Consent confirmed  - To OR when ready   Janean Sark Mahki Spikes 11/23/2022, 3:05 PM

## 2022-11-23 NOTE — Transfer of Care (Signed)
Immediate Anesthesia Transfer of Care Note  Patient: Vicki Hooper  Procedure(s) Performed: CERCLAGE CERVICAL  Patient Location: PACU  Anesthesia Type:Spinal  Level of Consciousness: awake, alert , and oriented  Airway & Oxygen Therapy: Patient Spontanous Breathing  Post-op Assessment: Report given to RN and Post -op Vital signs reviewed and stable  Post vital signs: Reviewed and stable  Last Vitals:  Vitals Value Taken Time  BP 103/66 11/23/22 1619  Temp    Pulse 65 11/23/22 1621  Resp 13 11/23/22 1621  SpO2 100 % 11/23/22 1621  Vitals shown include unvalidated device data.  Last Pain:  Vitals:   11/23/22 1020  TempSrc: Oral         Complications: No notable events documented.

## 2022-11-28 ENCOUNTER — Encounter (HOSPITAL_COMMUNITY): Payer: Self-pay | Admitting: Obstetrics and Gynecology

## 2022-11-28 NOTE — Discharge Summary (Signed)
Outpt surgery completed with no complications Pt monitored in PACU per protocol FHT documented Pt deemed stable for discharge home  Instructions given  F/U in office for routine prenatal visit as scheduled or prn

## 2022-11-29 ENCOUNTER — Encounter (HOSPITAL_COMMUNITY): Payer: Self-pay | Admitting: Obstetrics and Gynecology

## 2022-11-29 ENCOUNTER — Other Ambulatory Visit: Payer: Self-pay

## 2022-11-29 ENCOUNTER — Inpatient Hospital Stay (HOSPITAL_COMMUNITY)
Admission: AD | Admit: 2022-11-29 | Discharge: 2022-11-29 | Disposition: A | Discharge status: Home / Self Care | Payer: BC Managed Care – PPO | Attending: Obstetrics and Gynecology | Admitting: Obstetrics and Gynecology

## 2022-11-29 DIAGNOSIS — Z3A13 13 weeks gestation of pregnancy: Secondary | ICD-10-CM | POA: Diagnosis not present

## 2022-11-29 DIAGNOSIS — O219 Vomiting of pregnancy, unspecified: Secondary | ICD-10-CM

## 2022-11-29 LAB — URINALYSIS, ROUTINE W REFLEX MICROSCOPIC
Bilirubin Urine: NEGATIVE
Glucose, UA: NEGATIVE mg/dL
Ketones, ur: 20 mg/dL — AB
Nitrite: NEGATIVE
Protein, ur: 100 mg/dL — AB
Specific Gravity, Urine: 1.033 — ABNORMAL HIGH (ref 1.005–1.030)
Squamous Epithelial / HPF: >50 /HPF — ABNORMAL HIGH (ref 0–5)
WBC, UA: >50 WBC/hpf — ABNORMAL HIGH (ref 0–5)
pH: 6 (ref 5.0–8.0)

## 2022-11-29 MED ORDER — SCOPOLAMINE 1 MG/3DAYS TD PT72
1.0000 | MEDICATED_PATCH | TRANSDERMAL | Status: DC
  Administered 2022-11-29: 1.5 mg via TRANSDERMAL
  Filled 2022-11-29: qty 1

## 2022-11-29 MED ORDER — ONDANSETRON HCL 4 MG/2ML IJ SOLN
4.0000 mg | Freq: Once | INTRAMUSCULAR | Status: AC
  Administered 2022-11-29: 4 mg via INTRAVENOUS
  Filled 2022-11-29: qty 2

## 2022-11-29 MED ORDER — LACTATED RINGERS IV BOLUS
1000.0000 mL | Freq: Once | INTRAVENOUS | Status: AC
  Administered 2022-11-29: 1000 mL via INTRAVENOUS

## 2022-11-29 MED ORDER — SCOPOLAMINE 1 MG/3DAYS TD PT72: 1.0000 | MEDICATED_PATCH | TRANSDERMAL | 12 refills | Status: DC

## 2022-11-29 MED ORDER — ONDANSETRON 8 MG PO TBDP: 8.0000 mg | ORAL_TABLET | Freq: Three times a day (TID) | ORAL | 0 refills | Status: DC | PRN

## 2022-11-29 NOTE — Discharge Instructions (Signed)

## 2022-11-29 NOTE — MAU Provider Note (Signed)
History     CSN: [id number]  Arrival date and time: 11/29/22 0930   Event Date/Time   First Provider Initiated Contact with Patient 11/29/22 1004      Chief Complaint  Patient presents with   Nausea   HPI  Vicki Hooper is a 26 y.o. [id number] at [redacted]w[redacted]d who presents for evaluation of nausea and vomiting. Patient reports she has thrown up everything that she has eaten or drank over the last 72 hours. She has no known sick contacts but does not feel that this is her normal pregnancy nausea. She does not have any antiemetics at home. She feels generally achy from all the vomiting.  She denies any vaginal bleeding, discharge, and leaking of fluid. Denies any constipation, diarrhea or any urinary complaints.  OB History     Gravida  4   Para  1   Term      Preterm  1   AB  2   Living  1      SAB  2   IAB      Ectopic      Multiple  0   Live Births  1           History reviewed. No pertinent past medical history.  Past Surgical History:  Procedure Laterality Date   APPENDECTOMY     CERVICAL CERCLAGE N/A 11/23/2022   Procedure: CERCLAGE CERVICAL;  Surgeon: Edwinna Areola, DO;  Location: [facility] LD ORS;  Service: Gynecology;  Laterality: N/A;   MOUTH SURGERY     WRIST SURGERY Left     Family History  Problem Relation Age of Onset   Hypertension Mother    Diabetes Maternal Grandfather    Hypertension Maternal Grandfather    Diabetes Paternal Grandfather    Diabetes Sister     Social History   Tobacco Use   Smoking status: Former    Packs/day: 0.25    Years: 3.00    Total pack years: 0.75    Types: Cigarettes    Quit date: 02/01/2016    Years since quitting: 6.8   Smokeless tobacco: Never  Vaping Use   Vaping Use: Never used  Substance Use Topics   Alcohol use: No    Alcohol/week: 0.0 standard drinks of alcohol   Drug use: No    Allergies: No Known Allergies  No medications prior to admission.    Review of Systems  Constitutional:  Negative.  Negative for fatigue and fever.  HENT: Negative.    Respiratory: Negative.  Negative for shortness of breath.   Cardiovascular: Negative.  Negative for chest pain.  Gastrointestinal:  Positive for nausea and vomiting. Negative for abdominal pain, constipation and diarrhea.  Genitourinary: Negative.  Negative for dysuria, vaginal bleeding and vaginal discharge.  Neurological: Negative.  Negative for dizziness and headaches.   Physical Exam   Blood pressure (!) 104/57, pulse 69, temperature 98.8 F (37.1 C), temperature source Oral, resp. rate 16, height 5\' 7"  (1.702 m), weight 64.5 kg, SpO2 97 %, unknown if currently breastfeeding.  Patient Vitals for the past 24 hrs:  BP Temp Temp src Pulse Resp SpO2 Height Weight  11/29/22 1141 (!) 104/57 -- -- 69 -- -- -- --  11/29/22 0955 (!) 98/55 98.8 F (37.1 C) Oral 82 16 97 % -- --  11/29/22 0954 -- -- -- -- -- -- 5\' 7"  (1.702 m) 64.5 kg    Physical Exam Vitals and nursing note reviewed.  Constitutional:      General:  She is not in acute distress.    Appearance: She is well-developed.  HENT:     Head: Normocephalic.  Eyes:     Pupils: Pupils are equal, round, and reactive to light.  Cardiovascular:     Rate and Rhythm: Normal rate and regular rhythm.     Heart sounds: Normal heart sounds.  Pulmonary:     Effort: Pulmonary effort is normal. No respiratory distress.     Breath sounds: Normal breath sounds.  Abdominal:     General: Bowel sounds are normal. There is no distension.     Palpations: Abdomen is soft.     Tenderness: There is no abdominal tenderness.  Skin:    General: Skin is warm and dry.  Neurological:     Mental Status: She is alert and oriented to person, place, and time.  Psychiatric:        Mood and Affect: Mood normal.        Behavior: Behavior normal.        Thought Content: Thought content normal.        Judgment: Judgment normal.     FHT: 164 bpm   MAU Course  Procedures  Results for  orders placed or performed during the hospital encounter of 11/29/22 (from the past 24 hour(s))  Urinalysis, Routine w reflex microscopic Urine, Clean Catch     Status: Abnormal   Collection Time: 11/29/22 10:43 AM  Result Value Ref Range   Color, Urine AMBER (A) YELLOW   APPearance CLOUDY (A) CLEAR   Specific Gravity, Urine 1.033 (H) 1.005 - 1.030   pH 6.0 5.0 - 8.0   Glucose, UA NEGATIVE NEGATIVE mg/dL   Hgb urine dipstick SMALL (A) NEGATIVE   Bilirubin Urine NEGATIVE NEGATIVE   Ketones, ur 20 (A) NEGATIVE mg/dL   Protein, ur 254 (A) NEGATIVE mg/dL   Nitrite NEGATIVE NEGATIVE   Leukocytes,Ua MODERATE (A) NEGATIVE   RBC / HPF 6-10 0 - 5 RBC/hpf   WBC, UA >50 (H) 0 - 5 WBC/hpf   Bacteria, UA MANY (A) NONE SEEN   Squamous Epithelial / LPF >50 (H) 0 - 5 /HPF   Mucus PRESENT      MDM Labs ordered and reviewed.   UA LR bolus Zofran   Scop patch  Patient feeling better and able to tolerate PO  Assessment and Plan   1. Nausea and vomiting during pregnancy   2. [redacted] weeks gestation of pregnancy     -Discharge home in stable condition -Rx for zofran and scop patches sent to pharmacy -Second trimester precautions discussed -Patient advised to follow-up with OB as scheduled for prenatal care -Patient may return to MAU as needed or if her condition were to change or worsen  SALINDA SNEDEKER, CNM 11/29/2022, 10:04 AM

## 2022-11-29 NOTE — MAU Note (Signed)
Vicki Hooper is a 26 y.o. at [redacted]w[redacted]d here in MAU reporting: has been unable to keep down food or liquid for 72 hours. States she had a cerclage placed on Friday and the vomiting has been causing some pulling pain with her stitch. Also having generalized body pain from dehydration. No Rx at home for antiemetics.   Onset of complaint: ongoing  Pain score: 7/10  Vitals:   11/29/22 0955  BP: (!) 98/55  Pulse: 82  Resp: 16  Temp: 98.8 F (37.1 C)  SpO2: 97%     FHT:164  Lab orders placed from triage: UA, unable to give sample

## 2022-11-30 LAB — CULTURE, OB URINE

## 2022-12-03 NOTE — L&D Delivery Note (Signed)
Delivery Note Vicki Hooper is a [id number] at [redacted]w[redacted]d who had a spontaneous delivery at 1651 a viable female ("[name]") was delivered via  ROA.  APGAR: 9, 9; weight 6lb7oz (2920g)  .     Admitted for early labor and decreased fetal movement. Augmented with pitocin and AROM.  Progressed normally. Used nitrous oxide for pain management. Pushed for 5 minutes. Baby was delivered without difficulty. No nuchal cord.  Delayed cord clamping for 60 seconds. Delivery of placenta was spontaneous. Placenta was found to be intact, 3 -vessel cord was noted. The fundus was found to be firm. 1st degree perineal laceration was repaired in the normal sterile fashion with 3-0 vicryl after infiltration with 1% lidocaine. Estimated blood loss 242cc.Instrument and gauze counts were correct at the end of the procedure.   Placenta status:to L&D  Anesthesia:  local anesthesia for repair Episiotomy:  none Lacerations:  1st degree perineal Suture Repair: 3.0 vicryl Est. Blood Loss (mL):  242  Mom to postpartum.  Baby to Couplet care / Skin to Skin.  Charlett Nose 05/25/2023, 5:29 PM

## 2022-12-08 NOTE — Discharge Summary (Signed)
Pt deemed stable for discharge home after being monitored for tge required time post operatively  Pt had no complaints and FHTs were confirmed  Pt to follow up for pre-scheduled next ov

## 2023-05-07 LAB — OB RESULTS CONSOLE GBS: GBS: NEGATIVE

## 2023-05-25 ENCOUNTER — Inpatient Hospital Stay (HOSPITAL_COMMUNITY)
Admission: AD | Admit: 2023-05-25 | Discharge: 2023-05-26 | DRG: 807 | Disposition: A | Discharge status: Home / Self Care | Payer: BC Managed Care – PPO | Attending: Obstetrics and Gynecology | Admitting: Obstetrics and Gynecology

## 2023-05-25 ENCOUNTER — Encounter (HOSPITAL_COMMUNITY): Payer: Self-pay | Admitting: Obstetrics and Gynecology

## 2023-05-25 ENCOUNTER — Other Ambulatory Visit: Payer: Self-pay

## 2023-05-25 DIAGNOSIS — Z87891 Personal history of nicotine dependence: Secondary | ICD-10-CM | POA: Diagnosis not present

## 2023-05-25 DIAGNOSIS — O479 False labor, unspecified: Principal | ICD-10-CM | POA: Diagnosis present

## 2023-05-25 DIAGNOSIS — O368130 Decreased fetal movements, third trimester, not applicable or unspecified: Secondary | ICD-10-CM | POA: Diagnosis present

## 2023-05-25 DIAGNOSIS — Z3A38 38 weeks gestation of pregnancy: Secondary | ICD-10-CM

## 2023-05-25 LAB — CBC
HCT: 40.2 % (ref 36.0–46.0)
Hemoglobin: 13.4 g/dL (ref 12.0–15.0)
MCH: 31.5 pg (ref 26.0–34.0)
MCHC: 33.3 g/dL (ref 30.0–36.0)
MCV: 94.4 fL (ref 80.0–100.0)
Platelets: 267 10*3/uL (ref 150–400)
RBC: 4.26 MIL/uL (ref 3.87–5.11)
RDW: 13.5 % (ref 11.5–15.5)
WBC: 20.5 10*3/uL — ABNORMAL HIGH (ref 4.0–10.5)
nRBC: 0 % (ref 0.0–0.2)

## 2023-05-25 LAB — TYPE AND SCREEN
ABO/RH(D): O POS
Antibody Screen: NEGATIVE

## 2023-05-25 LAB — HIV ANTIBODY (ROUTINE TESTING W REFLEX): HIV Screen 4th Generation wRfx: NONREACTIVE

## 2023-05-25 MED ORDER — PHENYLEPHRINE 80 MCG/ML (10ML) SYRINGE FOR IV PUSH (FOR BLOOD PRESSURE SUPPORT): 80.0000 ug | PREFILLED_SYRINGE | INTRAVENOUS | Status: DC | PRN

## 2023-05-25 MED ORDER — TETANUS-DIPHTH-ACELL PERTUSSIS 5-2.5-18.5 LF-MCG/0.5 IM SUSY: 0.5000 mL | PREFILLED_SYRINGE | Freq: Once | INTRAMUSCULAR | Status: DC

## 2023-05-25 MED ORDER — WITCH HAZEL-GLYCERIN EX PADS: 1.0000 | MEDICATED_PAD | CUTANEOUS | Status: DC | PRN

## 2023-05-25 MED ORDER — ONDANSETRON HCL 4 MG/2ML IJ SOLN: 4.0000 mg | INTRAMUSCULAR | Status: DC | PRN

## 2023-05-25 MED ORDER — DIPHENHYDRAMINE HCL 25 MG PO CAPS: 25.0000 mg | ORAL_CAPSULE | Freq: Four times a day (QID) | ORAL | Status: DC | PRN

## 2023-05-25 MED ORDER — BISACODYL 10 MG RE SUPP: 10.0000 mg | Freq: Every day | RECTAL | Status: DC | PRN

## 2023-05-25 MED ORDER — LACTATED RINGERS IV SOLN: 500.0000 mL | Freq: Once | INTRAVENOUS | Status: DC

## 2023-05-25 MED ORDER — SOD CITRATE-CITRIC ACID 500-334 MG/5ML PO SOLN: 30.0000 mL | ORAL | Status: DC | PRN

## 2023-05-25 MED ORDER — DIBUCAINE (PERIANAL) 1 % EX OINT: 1.0000 | TOPICAL_OINTMENT | CUTANEOUS | Status: DC | PRN

## 2023-05-25 MED ORDER — LACTATED RINGERS IV SOLN: INTRAVENOUS | Status: DC

## 2023-05-25 MED ORDER — TERBUTALINE SULFATE 1 MG/ML IJ SOLN: 0.2500 mg | Freq: Once | INTRAMUSCULAR | Status: DC | PRN

## 2023-05-25 MED ORDER — LIDOCAINE HCL (PF) 1 % IJ SOLN
30.0000 mL | INTRAMUSCULAR | Status: AC | PRN
  Administered 2023-05-25: 30 mL via SUBCUTANEOUS
  Filled 2023-05-25: qty 30

## 2023-05-25 MED ORDER — FENTANYL-BUPIVACAINE-NACL 0.5-0.125-0.9 MG/250ML-% EP SOLN: 12.0000 mL/h | EPIDURAL | Status: DC | PRN

## 2023-05-25 MED ORDER — FENTANYL CITRATE (PF) 100 MCG/2ML IJ SOLN: 50.0000 ug | INTRAMUSCULAR | Status: DC | PRN

## 2023-05-25 MED ORDER — ONDANSETRON HCL 4 MG/2ML IJ SOLN
4.0000 mg | Freq: Four times a day (QID) | INTRAMUSCULAR | Status: DC | PRN
  Administered 2023-05-25: 4 mg via INTRAVENOUS
  Filled 2023-05-25: qty 2

## 2023-05-25 MED ORDER — IBUPROFEN 600 MG PO TABS
600.0000 mg | ORAL_TABLET | Freq: Four times a day (QID) | ORAL | Status: DC
  Administered 2023-05-25 – 2023-05-26 (×5): 600 mg via ORAL
  Filled 2023-05-25 (×5): qty 1

## 2023-05-25 MED ORDER — ACETAMINOPHEN 325 MG PO TABS: 650.0000 mg | ORAL_TABLET | ORAL | Status: DC | PRN

## 2023-05-25 MED ORDER — OXYCODONE-ACETAMINOPHEN 5-325 MG PO TABS: 2.0000 | ORAL_TABLET | ORAL | Status: DC | PRN

## 2023-05-25 MED ORDER — OXYCODONE-ACETAMINOPHEN 5-325 MG PO TABS: 1.0000 | ORAL_TABLET | ORAL | Status: DC | PRN

## 2023-05-25 MED ORDER — OXYCODONE HCL 5 MG PO TABS: 5.0000 mg | ORAL_TABLET | ORAL | Status: DC | PRN

## 2023-05-25 MED ORDER — OXYTOCIN BOLUS FROM INFUSION
333.0000 mL | Freq: Once | INTRAVENOUS | Status: AC
  Administered 2023-05-25: 333 mL via INTRAVENOUS

## 2023-05-25 MED ORDER — OXYCODONE HCL 5 MG PO TABS: 10.0000 mg | ORAL_TABLET | ORAL | Status: DC | PRN

## 2023-05-25 MED ORDER — EPHEDRINE 5 MG/ML INJ: 10.0000 mg | INTRAVENOUS | Status: DC | PRN

## 2023-05-25 MED ORDER — COCONUT OIL OIL: 1.0000 | TOPICAL_OIL | Status: DC | PRN

## 2023-05-25 MED ORDER — DOCUSATE SODIUM 100 MG PO CAPS
100.0000 mg | ORAL_CAPSULE | Freq: Two times a day (BID) | ORAL | Status: DC
  Administered 2023-05-26: 100 mg via ORAL
  Filled 2023-05-25: qty 1

## 2023-05-25 MED ORDER — OXYTOCIN-SODIUM CHLORIDE 30-0.9 UT/500ML-% IV SOLN
1.0000 m[IU]/min | INTRAVENOUS | Status: DC
  Administered 2023-05-25: 2 m[IU]/min via INTRAVENOUS

## 2023-05-25 MED ORDER — ONDANSETRON HCL 4 MG PO TABS: 4.0000 mg | ORAL_TABLET | ORAL | Status: DC | PRN

## 2023-05-25 MED ORDER — SIMETHICONE 80 MG PO CHEW: 80.0000 mg | CHEWABLE_TABLET | ORAL | Status: DC | PRN

## 2023-05-25 MED ORDER — LACTATED RINGERS IV SOLN: 500.0000 mL | INTRAVENOUS | Status: DC | PRN

## 2023-05-25 MED ORDER — PRENATAL MULTIVITAMIN CH
1.0000 | ORAL_TABLET | Freq: Every day | ORAL | Status: DC
  Administered 2023-05-26: 1 via ORAL
  Filled 2023-05-25: qty 1

## 2023-05-25 MED ORDER — FLEET ENEMA 7-19 GM/118ML RE ENEM: 1.0000 | ENEMA | Freq: Every day | RECTAL | Status: DC | PRN

## 2023-05-25 MED ORDER — OXYTOCIN-SODIUM CHLORIDE 30-0.9 UT/500ML-% IV SOLN
2.5000 [IU]/h | INTRAVENOUS | Status: DC
  Filled 2023-05-25: qty 500

## 2023-05-25 MED ORDER — BENZOCAINE-MENTHOL 20-0.5 % EX AERO
1.0000 | INHALATION_SPRAY | CUTANEOUS | Status: DC | PRN
  Administered 2023-05-25: 1 via TOPICAL
  Filled 2023-05-25: qty 56

## 2023-05-25 MED ORDER — DIPHENHYDRAMINE HCL 50 MG/ML IJ SOLN: 12.5000 mg | INTRAMUSCULAR | Status: DC | PRN

## 2023-05-25 NOTE — H&P (Signed)
Vicki Hooper is a 27 y.o. female [phone] [redacted]w[redacted]d presenting for contractions and decreased fetal movement. Contractions started yesterday and she rates them as 6/10. States decreased fetal movement yesterday and has not felt fetal movement today.   NST in MAU was reactive, exam 4.5/80/-2 Exam on 6/18 in office 3/80/-2   Pregnancy c/b: History of PPROM at [redacted]w[redacted]d in last pregnancy, had vaginal delivery at [redacted]w[redacted]d, complicated by retained placenta requiring D&C for removal. History of cerclage in this pregnancy, removed at [redacted]w[redacted]d   OB History     Gravida  4   Para  1   Term      Preterm  1   AB  2   Living  1      SAB  2   IAB      Ectopic      Multiple  0   Live Births  1          History reviewed. No pertinent past medical history. Past Surgical History:  Procedure Laterality Date   APPENDECTOMY     CERVICAL CERCLAGE N/A 11/23/2022   Procedure: CERCLAGE CERVICAL;  Surgeon: Edwinna Areola, DO;  Location: [facility] LD ORS;  Service: Gynecology;  Laterality: N/A;   MOUTH SURGERY     WRIST SURGERY Left    Family History: family history includes Diabetes in her maternal grandfather, paternal grandfather, and sister; Hypertension in her maternal grandfather and mother. Social History:  reports that she quit smoking about 7 years ago. Her smoking use included cigarettes. She has a 0.75 pack-year smoking history. She has never used smokeless tobacco. She reports that she does not drink alcohol and does not use drugs.     Maternal Diabetes: No Genetic Screening: Normal Maternal Ultrasounds/Referrals: Normal Fetal Ultrasounds or other Referrals:  None Maternal Substance Abuse:  No Significant Maternal Medications:  None Significant Maternal Lab Results:  Group B Strep negative Other Comments:  None  Review of Systems Per HPI Exam Physical Exam  Dilation: 4.5 Effacement (%): 80 Station: Ballotable Exam by:: K. Cowher RN Blood pressure 118/84, pulse 91,  temperature 98.1 F (36.7 C), temperature source Oral, resp. rate 17, height 5\' 6"  (1.676 m), weight 88 kg, unknown if currently breastfeeding. Gen: NAD, resting comfortably CVS: normal pulses Lungs: Nonlabored respirations Abd: Gravid abdomen Ext: no calf edema or tenderness  Fetal testing: 125bpm, mod variability, + accels, no decels Ctx q 2-4 mins  Prenatal labs: ABO, Rh:  --/--/O POS (06/22 1157) Antibody: NEG (06/22 1157) Rubella: Immune (11/17 0000) RPR: Nonreactive (11/17 0000)  HBsAg: Negative (11/17 0000)  HIV: Non-reactive (11/17 0000)  GBS: Negative/-- (06/04 0000)   Assessment/Plan: 27Y [id number] @ [redacted]w[redacted]d, early labor, decreased fetal movement Fetal wellbeing: cat I tracing Early labor: patient amenable to augmentation with pitocin, plan AROM when station lower GBS neg Pain control: patient hoping to avoid epidural but is open to it if she changes her mind   Charlett Nose 05/25/2023, 1:11 PM

## 2023-05-25 NOTE — Progress Notes (Signed)
OB Progress Note  S: pt breathing through contractions   O: Today's Vitals   05/25/23 1224 05/25/23 1227 05/25/23 1406 05/25/23 1533  BP: 118/84  126/79 120/77  Pulse: 91  100 77  Resp: 17  16   Temp: 98.1 F (36.7 C)  98 F (36.7 C)   TempSrc: Oral  Oral   Weight:  88 kg    Height:  5\' 6"  (1.676 m)    PainSc:  6      Body mass index is 31.31 kg/m.  SVE 6/80/-2 AROM mec stained fluid  FHR: 125bpm, mod variability, + accels, no decels Toco: ctx q 2-4 mins    A/P: 27Y [id number] @ [redacted]w[redacted]d, admitted for early labor and decreased fetal movement Fetal wellbeing: cat I tracing Labor: continue pitocin for augmentation (currently at 43mu/min), s/p AROM Pain control: planning for no epidural  M. Madai Nuccio MD 05/25/23 3:47 PM

## 2023-05-25 NOTE — MAU Note (Addendum)
.  Vicki Hooper is a 27 y.o. at [redacted]w[redacted]d here in MAU reporting:   Contractions every: 3-5 minutes Onset of ctx: Yesterday Pain score: 6/10  ROM: Intact Vaginal Bleeding: Scant    noticed scant amount of blood mixed in with clear mucous  Last SVE: 05/21/23    3/80/-2  Epidural: Undecided   Fetal Movement: Reports decreased FM   states fetal movement has been decreased since yesterday and has felt no movement so far today. UEA:540 via External  Vitals:   05/25/23 1137  BP: 132/75  Pulse: 92  Resp: 18  Temp: 98.4 F (36.9 C)       OB Office: [place] GBS: Negative HSV: Denies hx of HSV Lab orders placed from triage: MAU Labor Eval

## 2023-05-26 LAB — CBC
HCT: 33 % — ABNORMAL LOW (ref 36.0–46.0)
Hemoglobin: 10.6 g/dL — ABNORMAL LOW (ref 12.0–15.0)
MCH: 30.8 pg (ref 26.0–34.0)
MCHC: 32.1 g/dL (ref 30.0–36.0)
MCV: 95.9 fL (ref 80.0–100.0)
Platelets: 272 10*3/uL (ref 150–400)
RBC: 3.44 MIL/uL — ABNORMAL LOW (ref 3.87–5.11)
RDW: 13.7 % (ref 11.5–15.5)
WBC: 21.3 10*3/uL — ABNORMAL HIGH (ref 4.0–10.5)
nRBC: 0 % (ref 0.0–0.2)

## 2023-05-26 LAB — RPR: RPR Ser Ql: NONREACTIVE

## 2023-05-26 MED ORDER — IBUPROFEN 200 MG PO TABS: 600.0000 mg | ORAL_TABLET | Freq: Four times a day (QID) | ORAL | Status: DC | PRN

## 2023-05-26 NOTE — Lactation Note (Signed)
This note was copied from a baby's chart. Lactation Consultation Note  Patient Name: Vicki Hooper [id number] Date: 05/26/2023 Age:29 hours Reason for consult: Initial assessment;Early term 37-38.6wks  P2, [redacted]w[redacted]d.  6lb 4oz. First daughter was in the NICU for 3 mos and mother exclusively pumped. She states this baby has been sleepy at the breast with 5-10 min feedings.  Discussing waking techniques.  If sleepiness continues, recommend post pumping q 3 hours.  Mother will call for help today for assistance with feeding.   DEBP was set up.    Maternal Data Has patient been taught Hand Expression?: Yes Does the patient have breastfeeding experience prior to this delivery?: No  Feeding Mother's Current Feeding Choice: Breast Milk  Lactation Tools Discussed/Used Tools: Flanges;Pump Flange Size: 24 Breast pump type: Double-Electric Breast Pump Pump Education: Setup, frequency, and cleaning;Milk Storage Reason for Pumping: stimulation and supplementation Pumping frequency: q 3 hours if baby continues to be sleepy  Interventions Interventions: Breast feeding basics reviewed;DEBP;Education;LC Services brochure  Discharge Pump: Personal;DEBP (Spectra)  Consult Status Consult Status: Follow-up Date: 05/27/23 Follow-up type: In-patient    [name] [facility] 05/26/2023, 10:19 AM

## 2023-05-26 NOTE — Progress Notes (Signed)
Post Partum Day 1 Subjective: Patient is doing well this morning. Pain is controlled. Ambulating, voiding, tolerating PO. Minimal lochia.   Objective: Patient Vitals for the past 24 hrs:  BP Temp Temp src Pulse Resp SpO2 Height Weight  05/26/23 0737 124/76 98.6 F (37 C) Oral 84 16 98 % -- --  05/26/23 0406 131/72 98.2 F (36.8 C) -- 63 17 96 % -- --  05/25/23 2336 111/64 98.9 F (37.2 C) Oral 95 16 98 % -- --  05/25/23 1935 110/85 98.7 F (37.1 C) Oral 92 17 95 % -- --  05/25/23 1827 125/85 98.4 F (36.9 C) Oral 95 16 98 % -- --  05/25/23 1802 118/68 -- -- 74 -- -- -- --  05/25/23 1747 132/76 -- -- 72 -- -- -- --  05/25/23 1731 121/75 -- -- 65 -- -- -- --  05/25/23 1717 (!) 102/50 -- -- (!) 205 -- -- -- --  05/25/23 1702 (!) 99/45 -- -- 65 -- -- -- --  05/25/23 1533 120/77 -- -- 77 -- -- -- --  05/25/23 1406 126/79 98 F (36.7 C) Oral 100 16 -- -- --  05/25/23 1227 -- -- -- -- -- -- 5\' 6"  (1.676 m) 88 kg  05/25/23 1224 118/84 98.1 F (36.7 C) Oral 91 17 -- -- --  05/25/23 1146 124/82 -- -- 94 -- -- -- --  05/25/23 1137 132/75 98.4 F (36.9 C) Oral 92 18 -- -- --    Physical Exam:  General: alert, cooperative, and no distress Lochia: appropriate Uterine Fundus: firm DVT Evaluation: No evidence of DVT seen on physical exam.  Recent Labs    05/25/23 1157 05/26/23 0452  WBC 20.5* 21.3*  HGB 13.4 10.6*  HCT 40.2 33.0*  PLT 267 272    No results for input(s): "NA", "K", "CL", "CO2CT", "BUN", "CREATININE", "GLUCOSE", "BILITOT", "ALT", "AST", "ALKPHOS", "PROT", "ALBUMIN" in the last 72 hours.  No results for input(s): "CALCIUM", "MG", "PHOS" in the last 72 hours.  No results for input(s): "PROTIME", "APTT", "INR" in the last 72 hours.  No results for input(s): "PROTIME", "APTT", "INR", "FIBRINOGEN" in the last 72 hours. Assessment/Plan: Vicki Hooper 27 y.o. [id number] PPD#1 sp SVD 1. PPC: routine PP care 2. Rh pos 3. Dispo: motivated for discharge today,  instructions reviewed   LOS: 1 day   Vicki Hooper 05/26/2023, 9:20 AM

## 2023-05-26 NOTE — Discharge Instructions (Signed)
WHAT TO LOOK OUT FOR: Fever of 100.4 or above Mastitis: feels like flu and breasts hurt Infection: increased pain, swelling or redness Blood clots golf ball size or larger Postpartum depression   Congratulations on your newest addition! 

## 2023-05-26 NOTE — Discharge Summary (Signed)
Postpartum Discharge Summary  Date of Service updated 05/26/23      Patient Name: Vicki Hooper DOB: [birth year] MRN: [id number]  Date of admission: 05/25/2023 Delivery date:05/25/2023  Delivering provider: Derl Barrow E  Date of discharge: 05/26/2023  Admitting diagnosis: Uterine contractions [O47.9] Intrauterine pregnancy: [redacted]w[redacted]d     Secondary diagnosis:  Principal Problem:   Uterine contractions  Additional problems: decreased fetal movement    Discharge diagnosis: Term Pregnancy Delivered                                              Post partum procedures: none Augmentation: AROM and Pitocin Complications: None  Hospital course: Onset of Labor With Vaginal Delivery      27 y.o. yo [id number] at [redacted]w[redacted]d was admitted in Latent Labor on 05/25/2023. She was complaining of decreased fetal movement at the time so decision was made to admit and augment labor.  Labor course was complicated by nothing  Membrane Rupture Time/Date: 3:23 PM ,05/25/2023   Delivery Method:Vaginal, Spontaneous  Episiotomy: None  Lacerations:  1st degree  Patient had a postpartum course complicated by nothing.  She is ambulating, tolerating a regular diet, passing flatus, and urinating well. Patient is discharged home in stable condition on 05/26/23.  Newborn Data: Birth date:05/25/2023  Birth time:4:51 PM  Gender:Female  Living status:Living  Apgars:9 ,9  Weight:2920 g   Magnesium Sulfate received: No BMZ received: No Rhophylac:N/A MMR:N/A Transfusion:No  Physical exam  Vitals:   05/25/23 1935 05/25/23 2336 05/26/23 0406 05/26/23 0737  BP: 110/85 111/64 131/72 124/76  Pulse: 92 95 63 84  Resp: 17 16 17 16   Temp: 98.7 F (37.1 C) 98.9 F (37.2 C) 98.2 F (36.8 C) 98.6 F (37 C)  TempSrc: Oral Oral  Oral  SpO2: 95% 98% 96% 98%  Weight:      Height:       General: alert, cooperative, and no distress Lochia: appropriate Uterine Fundus: firm Incision: N/A DVT Evaluation: No  evidence of DVT seen on physical exam. Labs: Lab Results  Component Value Date   WBC 21.3 (H) 05/26/2023   HGB 10.6 (L) 05/26/2023   HCT 33.0 (L) 05/26/2023   MCV 95.9 05/26/2023   PLT 272 05/26/2023      Latest Ref Rng & Units 06/15/2021    9:56 AM  CMP  Glucose 70 - 99 mg/dL 147   BUN 6 - 20 mg/dL 7   Creatinine 8.29 - 5.62 mg/dL 1.30   Sodium 865 - 784 mmol/L 132   Potassium 3.5 - 5.1 mmol/L 3.4   Chloride 98 - 111 mmol/L 101   CO2 22 - 32 mmol/L 23   Calcium 8.9 - 10.3 mg/dL 7.1   Total Protein 6.5 - 8.1 g/dL 6.3   Total Bilirubin 0.3 - 1.2 mg/dL 0.6   Alkaline Phos 38 - 126 U/L 81   AST 15 - 41 U/L 19   ALT 0 - 44 U/L 31    Edinburgh Score:    06/27/2021    2:34 PM  Edinburgh Postnatal Depression Scale Screening Tool  I have been able to laugh and see the funny side of things. 0  I have looked forward with enjoyment to things. 0  I have blamed myself unnecessarily when things went wrong. 2  I have been anxious or worried for no good reason. 2  I have felt scared or panicky for no good reason. 0  Things have been getting on top of me. 1  I have been so unhappy that I have had difficulty sleeping. 0  I have felt sad or miserable. 0  I have been so unhappy that I have been crying. 1  The thought of harming myself has occurred to me. 0  Edinburgh Postnatal Depression Scale Total 6      After visit meds:  Allergies as of 05/26/2023   No Known Allergies      Medication List     STOP taking these medications    ondansetron 8 MG disintegrating tablet Commonly known as: ZOFRAN-ODT   progesterone 200 MG Supp   scopolamine 1 MG/3DAYS Commonly known as: TRANSDERM-SCOP       TAKE these medications    acetaminophen 500 MG tablet Commonly known as: TYLENOL Take 500-1,000 mg by mouth every 6 (six) hours as needed (pain.).   acetaminophen 500 MG tablet Commonly known as: TYLENOL Take 2 tablets (1,000 mg total) by mouth every 6 (six) hours as needed for  headache, moderate pain or fever.   ibuprofen 200 MG tablet Commonly known as: ADVIL Take 3 tablets (600 mg total) by mouth every 6 (six) hours as needed for mild pain, moderate pain or cramping.   multivitamin-prenatal 27-0.8 MG Tabs tablet Take 1 tablet by mouth daily at 4 PM.   PreviDent 5000 Plus 1.1 % Crea dental cream Generic drug: sodium fluoride Place 1 Application onto teeth at bedtime.         Discharge home in stable condition Infant Feeding: Bottle Infant Disposition:home with mother Discharge instruction: per After Visit Summary and Postpartum booklet. Activity: Advance as tolerated. Pelvic rest for 6 weeks.  Diet: routine diet Anticipated Birth Control: Unsure Postpartum Appointment:6 weeks Additional Postpartum F/U:  none Future Appointments:No future appointments. Follow up Visit:  Follow-up Information     [facility]. Schedule an appointment as soon as possible for a visit in 6 week(s).   Contact information: [address]  [address] [place] [address] [phone]                     05/26/2023 Charlett Nose, MD

## 2023-06-09 ENCOUNTER — Inpatient Hospital Stay (HOSPITAL_COMMUNITY): Admission: AD | Admit: 2023-06-09 | Payer: BC Managed Care – PPO | Source: Home / Self Care

## 2023-06-09 ENCOUNTER — Inpatient Hospital Stay (HOSPITAL_COMMUNITY): Payer: BC Managed Care – PPO

## 2023-06-17 ENCOUNTER — Telehealth (HOSPITAL_COMMUNITY): Payer: Self-pay | Admitting: *Deleted

## 2023-06-17 NOTE — Telephone Encounter (Signed)
06/17/2023  Name: DARSHAY DEUPREE MRN: [id number] DOB: [birth year]  Reason for Call:  Transition of Care Hospital Discharge Call  Contact Status: Patient Contact Status: Complete  Language assistant needed:          Follow-Up Questions: Do You Have Any Concerns About Your Health As You Heal From Delivery?: No Do You Have Any Concerns About Your Infants Health?: No  Edinburgh Postnatal Depression Scale:  In the Past 7 Days:   EPDS not done at this time. Patient stated, "My pediatrician talked to me about it and my OB has reached out to check on me. I'm doing fine." PHQ2-9 Depression Scale:     Discharge Follow-up: Edinburgh score requires follow up?: N/A Patient was advised of the following resources:: Breastfeeding Support Group, Support Group  Post-discharge interventions: Reviewed Newborn Safe Sleep Practices  Signature Deforest Hoyles, RN, [phone]

## 2023-12-04 NOTE — L&D Delivery Note (Signed)
 Delivery Note Pt labored quickly to complete. She pushed twice and at 1:10 AM a viable female was delivered via Vaginal, Spontaneous (Presentation:   Occiput Anterior).  APGAR: 8, 9; weight  pending . Anterior and posterior shoulders delivered easily with next two pushes and body easily followed. Cord clamped and cut after a minute delay with baby on mohters abdomen. Cord blood obtained.     Placenta status: Spontaneous;Pathology, Intact.  Cord: 3 vessels with the following complications: trailing membranes - took to deliver intact. None.  Cord pH: n/a  Anesthesia: Epidural Episiotomy: None Lacerations: Abrasions only  Suture Repair: n/a Est. Blood Loss (mL): 266  Mom to postpartum.  Baby to Couplet care / Skin to Skin Right cleft lp noted; no apparent involvement of palate .  Vicki Hooper 10/14/2024, 1:40 AM

## 2024-03-25 LAB — OB RESULTS CONSOLE GC/CHLAMYDIA
Chlamydia: NEGATIVE
Neisseria Gonorrhea: NEGATIVE

## 2024-03-25 LAB — OB RESULTS CONSOLE HIV ANTIBODY (ROUTINE TESTING): HIV: NONREACTIVE

## 2024-03-25 LAB — OB RESULTS CONSOLE RUBELLA ANTIBODY, IGM: Rubella: IMMUNE

## 2024-03-25 LAB — HEPATITIS C ANTIBODY: HCV Ab: NEGATIVE

## 2024-03-25 LAB — OB RESULTS CONSOLE HEPATITIS B SURFACE ANTIGEN: Hepatitis B Surface Ag: NEGATIVE

## 2024-03-31 ENCOUNTER — Telehealth (HOSPITAL_COMMUNITY): Payer: Self-pay | Admitting: *Deleted

## 2024-03-31 ENCOUNTER — Encounter (HOSPITAL_COMMUNITY): Payer: Self-pay

## 2024-03-31 NOTE — Telephone Encounter (Signed)
 Preadmission screen

## 2024-04-01 ENCOUNTER — Telehealth (HOSPITAL_COMMUNITY): Payer: Self-pay | Admitting: *Deleted

## 2024-04-01 ENCOUNTER — Encounter (HOSPITAL_COMMUNITY): Payer: Self-pay | Admitting: *Deleted

## 2024-04-01 NOTE — Telephone Encounter (Signed)
 Preadmission screen Arrive at 1000 on May 10.  NPO eating after MN.  Clear liquids until 1000.  Pt is taking antibiotics currently.  Not sure if she will be finished at time of cerclage.  Instructed to take antibiotics as prescribed.  All questions answered and verbalized understanding.

## 2024-04-02 ENCOUNTER — Encounter (HOSPITAL_COMMUNITY): Payer: Self-pay | Admitting: Obstetrics and Gynecology

## 2024-04-02 ENCOUNTER — Other Ambulatory Visit: Payer: Self-pay

## 2024-04-02 ENCOUNTER — Inpatient Hospital Stay (HOSPITAL_COMMUNITY)
Admission: AD | Admit: 2024-04-02 | Discharge: 2024-04-02 | Disposition: A | Discharge status: Home / Self Care | Attending: Obstetrics and Gynecology | Admitting: Obstetrics and Gynecology

## 2024-04-02 DIAGNOSIS — Z3A11 11 weeks gestation of pregnancy: Secondary | ICD-10-CM | POA: Insufficient documentation

## 2024-04-02 DIAGNOSIS — O09219 Supervision of pregnancy with history of pre-term labor, unspecified trimester: Secondary | ICD-10-CM | POA: Insufficient documentation

## 2024-04-02 DIAGNOSIS — O219 Vomiting of pregnancy, unspecified: Secondary | ICD-10-CM | POA: Insufficient documentation

## 2024-04-02 HISTORY — DX: Other specified health status: Z78.9

## 2024-04-02 LAB — URINALYSIS, ROUTINE W REFLEX MICROSCOPIC
Bacteria, UA: NONE SEEN
Bilirubin Urine: NEGATIVE
Glucose, UA: NEGATIVE mg/dL
Hgb urine dipstick: NEGATIVE
Ketones, ur: 80 mg/dL — AB
Nitrite: NEGATIVE
Protein, ur: 30 mg/dL — AB
Specific Gravity, Urine: 1.023 (ref 1.005–1.030)
pH: 7 (ref 5.0–8.0)

## 2024-04-02 LAB — COMPREHENSIVE METABOLIC PANEL WITH GFR
ALT: 15 U/L (ref 0–44)
AST: 20 U/L (ref 15–41)
Albumin: 3.9 g/dL (ref 3.5–5.0)
Alkaline Phosphatase: 55 U/L (ref 38–126)
Anion gap: 8 (ref 5–15)
BUN: 7 mg/dL (ref 6–20)
CO2: 20 mmol/L — ABNORMAL LOW (ref 22–32)
Calcium: 9.2 mg/dL (ref 8.9–10.3)
Chloride: 109 mmol/L (ref 98–111)
Creatinine, Ser: 0.52 mg/dL (ref 0.44–1.00)
GFR, Estimated: >60 mL/min (ref >60)
Glucose, Bld: 84 mg/dL (ref 70–99)
Potassium: 3.7 mmol/L (ref 3.5–5.1)
Sodium: 137 mmol/L (ref 135–145)
Total Bilirubin: 1.1 mg/dL (ref 0.0–1.2)
Total Protein: 6.8 g/dL (ref 6.5–8.1)

## 2024-04-02 MED ORDER — LACTATED RINGERS IV BOLUS
1000.0000 mL | Freq: Once | INTRAVENOUS | Status: AC
  Administered 2024-04-02: 1000 mL via INTRAVENOUS

## 2024-04-02 MED ORDER — FAMOTIDINE IN NACL 20-0.9 MG/50ML-% IV SOLN
20.0000 mg | Freq: Once | INTRAVENOUS | Status: AC
  Administered 2024-04-02: 20 mg via INTRAVENOUS
  Filled 2024-04-02: qty 50

## 2024-04-02 MED ORDER — PROMETHAZINE HCL 25 MG PO TABS: 25.0000 mg | ORAL_TABLET | Freq: Four times a day (QID) | ORAL | 2 refills | Status: DC | PRN

## 2024-04-02 MED ORDER — ONDANSETRON HCL 4 MG/2ML IJ SOLN
4.0000 mg | Freq: Once | INTRAMUSCULAR | Status: AC
  Administered 2024-04-02: 4 mg via INTRAVENOUS
  Filled 2024-04-02: qty 2

## 2024-04-02 NOTE — Discharge Instructions (Addendum)
 NAUSEA AT HOME: Continue at-home measures like vitamin B6 supplement, ginger, small meals every 1-2 hours focusing on protein and bland foods. If nausea persists, you can take medicines like Benadryl , Dramamine, or non-drowsy Dramamine once a day. Alternatively, you can use the Phenergan  as needed. If nausea persists talk to your OB about further options.  If needed, you can also add famotidine  (Pepcid ) to control stomach acid production.

## 2024-04-02 NOTE — MAU Provider Note (Signed)
 Chief Complaint:  No chief complaint on file.   HPI   None     Vicki Hooper is a 28 y.o. [id number] at [redacted]w[redacted]d who presents to maternity admissions reporting nausea and vomiting. She reports inability to keep down fluids or solids. Nausea has been a minor issue throughout this pregnancy, but has been worsening. Severe symptoms have been present for approximately 3 days. She also reports that she passed out earlier today and currently feels shaky. She denies abdominal pain , flank pain, anorexia, shortness of breath, fever, and chills. At home, she has tried OTC remedies like ginger, vitamin B6 with no relief, she does not have any prescription nausea medicine. Alleviating factors: none. Aggravating factors: eating or drinking. She does have a history of nausea in pregnancy, Phenergan  has worked well at home in past pregnancies. Denies fatigue, weight changes, heat/cold intolerance, skin/hair changes, CVS symptoms. Denies vaginal bleeding.  Pregnancy Course: Receives care at [facility]. Available prenatal records reviewed. Pregnancy complicated by history of preterm labor; history of SAB x2; and incompetent cervix, planning on cerclage surgery on 04/11/2024.  Past Medical History:  Diagnosis Date   Medical history non-contributory    OB History  Gravida Para Term Preterm AB Living  5 2 1 1 2 2   SAB IAB Ectopic Multiple Live Births  2   0 2    # Outcome Date GA Lbr Len/2nd Weight Sex Type Anes PTL Lv  5 Current           4 Term 05/25/23 [redacted]w[redacted]d 01:46 / 00:05 2920 g F Vag-Spont Local  LIV  3 Preterm 06/15/21 [redacted]w[redacted]d 13:32 / 00:03 780 g F Vag-Spont EPI  LIV  2 SAB 2020 [redacted]w[redacted]d         1 SAB 2017 [redacted]w[redacted]d          Past Surgical History:  Procedure Laterality Date   APPENDECTOMY     CERVICAL CERCLAGE N/A 11/23/2022   Procedure: CERCLAGE CERVICAL;  Surgeon: Loa Riling, DO;  Location: [facility] LD ORS;  Service: Gynecology;  Laterality: N/A;   MOUTH SURGERY     WRIST SURGERY Left     Family History  Problem Relation Age of Onset   Hypertension Mother    Diabetes Maternal Grandfather    Hypertension Maternal Grandfather    Diabetes Paternal Grandfather    Diabetes Sister    Social History   Tobacco Use   Smoking status: Former    Current packs/day: 0.00    Average packs/day: 0.3 packs/day for 3.0 years (0.8 ttl pk-yrs)    Types: Cigarettes    Start date: 01/31/2013    Quit date: 02/01/2016    Years since quitting: 8.1   Smokeless tobacco: Never  Vaping Use   Vaping status: Never Used  Substance Use Topics   Alcohol use: No    Alcohol/week: 0.0 standard drinks of alcohol   Drug use: No   No Known Allergies Medications Prior to Admission  Medication Sig Dispense Refill Last Dose/Taking   acetaminophen  (TYLENOL ) 500 MG tablet Take 500-1,000 mg by mouth every 6 (six) hours as needed (pain.).      acetaminophen  (TYLENOL ) 500 MG tablet Take 2 tablets (1,000 mg total) by mouth every 6 (six) hours as needed for headache, moderate pain or fever. 30 tablet 0    ibuprofen  (ADVIL ) 200 MG tablet Take 3 tablets (600 mg total) by mouth every 6 (six) hours as needed for mild pain, moderate pain or cramping.  Prenatal Vit-Fe Fumarate-FA (MULTIVITAMIN-PRENATAL) 27-0.8 MG TABS tablet Take 1 tablet by mouth daily at 4 PM.   03/30/2024   PREVIDENT 5000 PLUS 1.1 % CREA dental cream Place 1 Application onto teeth at bedtime.       I have reviewed patient's Past Medical Hx, Surgical Hx, Family Hx, Social Hx, medications and allergies.   ROS  Pertinent items noted in HPI and remainder of comprehensive ROS otherwise negative.   PHYSICAL EXAM  Patient Vitals for the past 24 hrs:  BP Temp Temp src Pulse Resp SpO2 Height Weight  04/02/24 1610 (!) 106/57 -- -- (!) 47 18 99 % -- --  04/02/24 1448 114/69 98.1 F (36.7 C) Oral (!) 51 18 100 % -- --  04/02/24 1443 -- -- -- -- -- -- 5\' 6"  (1.676 m) 76.4 kg    Constitutional: Well-developed, well-nourished female in no acute  distress.  HEENT: atraumatic, normocephalic. Neck has normal ROM. EOM intact. Cardiovascular: normal rate & rhythm, warm and well-perfused Respiratory: normal effort, no problems with respiration noted. CTA bilaterally. GI: Abd soft, non-tender, non-distended MSK: Extremities nontender, no edema, normal ROM Skin: warm and dry. Acyanotic, no jaundice or pallor. Neurologic: Alert and oriented x 4. No abnormal coordination. Psychiatric: Normal mood. Speech not slurred, not rapid/pressured. Patient is cooperative. GU: no CVA tenderness    Labs: Results for orders placed or performed during the hospital encounter of 04/02/24 (from the past 24 hours)  Urinalysis, Routine w reflex microscopic -Urine, Clean Catch     Status: Abnormal   Collection Time: 04/02/24  2:50 PM  Result Value Ref Range   Color, Urine YELLOW YELLOW   APPearance HAZY (A) CLEAR   Specific Gravity, Urine 1.023 1.005 - 1.030   pH 7.0 5.0 - 8.0   Glucose, UA NEGATIVE NEGATIVE mg/dL   Hgb urine dipstick NEGATIVE NEGATIVE   Bilirubin Urine NEGATIVE NEGATIVE   Ketones, ur 80 (A) NEGATIVE mg/dL   Protein, ur 30 (A) NEGATIVE mg/dL   Nitrite NEGATIVE NEGATIVE   Leukocytes,Ua TRACE (A) NEGATIVE   RBC / HPF 0-5 0 - 5 RBC/hpf   WBC, UA 0-5 0 - 5 WBC/hpf   Bacteria, UA NONE SEEN NONE SEEN   Squamous Epithelial / HPF 6-10 0 - 5 /HPF   Mucus PRESENT   Comprehensive metabolic panel     Status: Abnormal   Collection Time: 04/02/24  4:04 PM  Result Value Ref Range   Sodium 137 135 - 145 mmol/L   Potassium 3.7 3.5 - 5.1 mmol/L   Chloride 109 98 - 111 mmol/L   CO2 20 (L) 22 - 32 mmol/L   Glucose, Bld 84 70 - 99 mg/dL   BUN 7 6 - 20 mg/dL   Creatinine, Ser 1.61 0.44 - 1.00 mg/dL   Calcium  9.2 8.9 - 10.3 mg/dL   Total Protein 6.8 6.5 - 8.1 g/dL   Albumin 3.9 3.5 - 5.0 g/dL   AST 20 15 - 41 U/L   ALT 15 0 - 44 U/L   Alkaline Phosphatase 55 38 - 126 U/L   Total Bilirubin 1.1 0.0 - 1.2 mg/dL   GFR, Estimated >09 >60 mL/min    Anion gap 8 5 - 15    Imaging:  No results found.   MDM & MAU COURSE  MDM: Low  MAU Course: -Vital signs show bradycardia, otherwise within normal limits. -Checking CMP for electrolyte abnormalities. UA shows dehydration. -IV fluids for dehydration. Zofran  and famotidine  for nausea. -CMP without significant abnormalities. -  On reassessment, symptoms improved with fluids and medication. PO challenge successful.  Differential diagnosis considered for nausea/vomiting includes but is not limited to: nausea due to pregnancy, hyperemesis gravidarum, viral infection, gastroenteritis  Orders Placed This Encounter  Procedures   Urinalysis, Routine w reflex microscopic -Urine, Clean Catch   Comprehensive metabolic panel   Discharge patient   Meds ordered this encounter  Medications   lactated ringers  bolus 1,000 mL   ondansetron  (ZOFRAN ) injection 4 mg   famotidine  (PEPCID ) IVPB 20 mg premix   promethazine  (PHENERGAN ) 25 MG tablet    Sig: Take 1 tablet (25 mg total) by mouth every 6 (six) hours as needed for nausea or vomiting.    Dispense:  30 tablet    Refill:  2    ASSESSMENT   1. Nausea and vomiting during pregnancy   2. [redacted] weeks gestation of pregnancy     PLAN  Discharge home in stable condition with return precautions.  Continue at-home remedies for nausea like small high-protein snacks, ginger, vitamin B6. Sent prescription for Phenergan  to use at home as needed until OB follow up.    Follow-up Information     [facility] Follow up.   Specialty: Obstetrics and Gynecology Why: As scheduled for ongoing prenatal care Contact information: [address]  [address] [phone]                 Allergies as of 04/02/2024   No Known Allergies      Medication List     STOP taking these medications    ibuprofen  200 MG tablet Commonly known as: ADVIL    PreviDent 5000 Plus 1.1 % Crea  dental cream Generic drug: sodium fluoride       TAKE these medications    acetaminophen  500 MG tablet Commonly known as: TYLENOL  Take 2 tablets (1,000 mg total) by mouth every 6 (six) hours as needed for headache, moderate pain or fever. What changed: Another medication with the same name was removed. Continue taking this medication, and follow the directions you see here.   multivitamin-prenatal 27-0.8 MG Tabs tablet Take 1 tablet by mouth daily at 4 PM.   promethazine  25 MG tablet Commonly known as: PHENERGAN  Take 1 tablet (25 mg total) by mouth every 6 (six) hours as needed for nausea or vomiting.        Noreene Bearded, PA

## 2024-04-02 NOTE — MAU Note (Signed)
 Vicki Hooper is a 28 y.o. at [redacted]w[redacted]d here in MAU reporting: she's been unable to keep any foods or liquids down for the past 3 days.  States she briefly passed out earlier today.  Reports doesn't have any meds prescribed for N/V.  Also states she feels shaky.  Denies VB or abdominal pain  LMP: NA Onset of complaint: 3 days Pain score: 0 Vitals:   04/02/24 1448  BP: 114/69  Pulse: (!) 51  Resp: 18  Temp: 98.1 F (36.7 C)  SpO2: 100%     FHT: 163 bpm  Lab orders placed from triage: UA

## 2024-04-11 ENCOUNTER — Encounter (HOSPITAL_COMMUNITY)
Admission: RE | Disposition: A | Discharge status: Home / Self Care | Payer: Self-pay | Source: Home / Self Care | Attending: Obstetrics and Gynecology

## 2024-04-11 ENCOUNTER — Ambulatory Visit (HOSPITAL_COMMUNITY)

## 2024-04-11 ENCOUNTER — Ambulatory Visit (HOSPITAL_COMMUNITY)
Admission: RE | Admit: 2024-04-11 | Discharge: 2024-04-11 | Disposition: A | Discharge status: Home / Self Care | Attending: Obstetrics and Gynecology | Admitting: Obstetrics and Gynecology

## 2024-04-11 ENCOUNTER — Encounter (HOSPITAL_COMMUNITY): Payer: Self-pay | Admitting: Obstetrics and Gynecology

## 2024-04-11 DIAGNOSIS — Z8759 Personal history of other complications of pregnancy, childbirth and the puerperium: Secondary | ICD-10-CM | POA: Diagnosis not present

## 2024-04-11 DIAGNOSIS — Z87891 Personal history of nicotine dependence: Secondary | ICD-10-CM | POA: Diagnosis not present

## 2024-04-11 DIAGNOSIS — O3431 Maternal care for cervical incompetence, first trimester: Secondary | ICD-10-CM | POA: Insufficient documentation

## 2024-04-11 DIAGNOSIS — Z3A12 12 weeks gestation of pregnancy: Secondary | ICD-10-CM | POA: Insufficient documentation

## 2024-04-11 DIAGNOSIS — O09291 Supervision of pregnancy with other poor reproductive or obstetric history, first trimester: Secondary | ICD-10-CM

## 2024-04-11 DIAGNOSIS — N883 Incompetence of cervix uteri: Secondary | ICD-10-CM

## 2024-04-11 HISTORY — PX: CERVICAL CERCLAGE: SHX1329

## 2024-04-11 LAB — CBC
HCT: 37 % (ref 36.0–46.0)
Hemoglobin: 12 g/dL (ref 12.0–15.0)
MCH: 28.2 pg (ref 26.0–34.0)
MCHC: 32.4 g/dL (ref 30.0–36.0)
MCV: 87.1 fL (ref 80.0–100.0)
Platelets: 262 10*3/uL (ref 150–400)
RBC: 4.25 MIL/uL (ref 3.87–5.11)
RDW: 15.9 % — ABNORMAL HIGH (ref 11.5–15.5)
WBC: 11.2 10*3/uL — ABNORMAL HIGH (ref 4.0–10.5)
nRBC: 0 % (ref 0.0–0.2)

## 2024-04-11 LAB — TYPE AND SCREEN
ABO/RH(D): O POS
Antibody Screen: NEGATIVE

## 2024-04-11 LAB — RPR: RPR Ser Ql: NONREACTIVE

## 2024-04-11 SURGERY — CERCLAGE, CERVIX, VAGINAL APPROACH
Anesthesia: Spinal

## 2024-04-11 MED ORDER — ONDANSETRON HCL 4 MG/2ML IJ SOLN
INTRAMUSCULAR | Status: DC | PRN
  Administered 2024-04-11: 4 mg via INTRAVENOUS

## 2024-04-11 MED ORDER — ACETAMINOPHEN 500 MG PO TABS
ORAL_TABLET | ORAL | Status: AC
  Filled 2024-04-11: qty 2

## 2024-04-11 MED ORDER — OXYCODONE HCL 5 MG PO TABS: 5.0000 mg | ORAL_TABLET | Freq: Once | ORAL | Status: DC | PRN

## 2024-04-11 MED ORDER — FENTANYL CITRATE (PF) 100 MCG/2ML IJ SOLN
INTRAMUSCULAR | Status: AC
  Filled 2024-04-11: qty 2

## 2024-04-11 MED ORDER — LIDOCAINE-EPINEPHRINE (PF) 2 %-1:200000 IJ SOLN
INTRAMUSCULAR | Status: AC
  Filled 2024-04-11: qty 10

## 2024-04-11 MED ORDER — CHLOROPROCAINE HCL (PF) 3 % IJ SOLN
INTRAMUSCULAR | Status: AC
Start: 2024-04-11 — End: ?
  Filled 2024-04-11: qty 20

## 2024-04-11 MED ORDER — ONDANSETRON HCL 4 MG/2ML IJ SOLN
4.0000 mg | Freq: Once | INTRAMUSCULAR | Status: AC
  Administered 2024-04-11: 4 mg via INTRAVENOUS

## 2024-04-11 MED ORDER — ONDANSETRON HCL 4 MG/2ML IJ SOLN
INTRAMUSCULAR | Status: AC
  Filled 2024-04-11: qty 2

## 2024-04-11 MED ORDER — LIDOCAINE-EPINEPHRINE 2 %-1:100000 IJ SOLN
20.0000 mL | Freq: Once | INTRAMUSCULAR | Status: AC
  Administered 2024-04-11: 20 mL
  Filled 2024-04-11: qty 20

## 2024-04-11 MED ORDER — CHLOROPROCAINE HCL (PF) 3 % IJ SOLN
INTRAMUSCULAR | Status: DC | PRN
  Administered 2024-04-11: 1.5 mL

## 2024-04-11 MED ORDER — ACETAMINOPHEN 500 MG PO TABS
1000.0000 mg | ORAL_TABLET | ORAL | Status: AC
  Administered 2024-04-11: 1000 mg via ORAL

## 2024-04-11 MED ORDER — FENTANYL CITRATE (PF) 100 MCG/2ML IJ SOLN: 25.0000 ug | INTRAMUSCULAR | Status: DC | PRN

## 2024-04-11 MED ORDER — LACTATED RINGERS IV SOLN: INTRAVENOUS | Status: DC

## 2024-04-11 MED ORDER — MEPERIDINE HCL 25 MG/ML IJ SOLN: 6.2500 mg | INTRAMUSCULAR | Status: DC | PRN

## 2024-04-11 MED ORDER — CHLOROPROCAINE HCL (PF) 3 % IJ SOLN: INTRAMUSCULAR | Status: DC | PRN

## 2024-04-11 MED ORDER — POVIDONE-IODINE 10 % EX SWAB
2.0000 | Freq: Once | CUTANEOUS | Status: AC
  Administered 2024-04-11: 2 via TOPICAL

## 2024-04-11 MED ORDER — CHLOROPROCAINE HCL 50 MG/5ML IT SOLN: INTRATHECAL | Status: DC | PRN

## 2024-04-11 MED ORDER — SOD CITRATE-CITRIC ACID 500-334 MG/5ML PO SOLN
30.0000 mL | ORAL | Status: AC
  Administered 2024-04-11: 30 mL via ORAL

## 2024-04-11 MED ORDER — FENTANYL CITRATE (PF) 100 MCG/2ML IJ SOLN
INTRAMUSCULAR | Status: DC | PRN
  Administered 2024-04-11: 25 ug via INTRATHECAL

## 2024-04-11 MED ORDER — OXYCODONE HCL 5 MG/5ML PO SOLN
5.0000 mg | Freq: Once | ORAL | Status: DC | PRN
Start: 2024-04-11 — End: 2024-04-11

## 2024-04-11 MED ORDER — SOD CITRATE-CITRIC ACID 500-334 MG/5ML PO SOLN
ORAL | Status: AC
  Filled 2024-04-11: qty 30

## 2024-04-11 MED ORDER — ONDANSETRON HCL 4 MG/2ML IJ SOLN: 4.0000 mg | Freq: Once | INTRAMUSCULAR | Status: DC | PRN

## 2024-04-11 SURGICAL SUPPLY — 17 items
CANISTER SUCT 3000ML PPV (MISCELLANEOUS) ×1 IMPLANT
GLOVE BIO SURGEON STRL SZ 6.5 (GLOVE) ×2 IMPLANT
GLOVE BIOGEL PI IND STRL 7.0 (GLOVE) ×1 IMPLANT
GOWN STRL REUS W/TWL LRG LVL3 (GOWN DISPOSABLE) ×2 IMPLANT
NDL MAYO CATGUT SZ4 TPR NDL (NEEDLE) ×1 IMPLANT
NEEDLE MAYO CATGUT SZ4 (NEEDLE) ×1 IMPLANT
PACK VAGINAL MINOR WOMEN LF (CUSTOM PROCEDURE TRAY) ×1 IMPLANT
PAD OB MATERNITY 4.3X12.25 (PERSONAL CARE ITEMS) ×1 IMPLANT
PAD PREP 24X48 CUFFED NSTRL (MISCELLANEOUS) ×1 IMPLANT
SUT MERSILENE FIBER S 5 MO-4 1 (SUTURE) IMPLANT
SUT POLYDEK 5 CE 75 36 (SUTURE) ×2 IMPLANT
SUT PROLENE 1 CTX 30 8455H (SUTURE) IMPLANT
SYR BULB IRRIGATION 50ML (SYRINGE) ×1 IMPLANT
TOWEL OR 17X24 6PK STRL BLUE (TOWEL DISPOSABLE) ×2 IMPLANT
TRAY FOLEY W/BAG SLVR 14FR (SET/KITS/TRAYS/PACK) ×1 IMPLANT
TUBING NON-CON 1/4 X 20 CONN (TUBING) ×1 IMPLANT
YANKAUER SUCT BULB TIP NO VENT (SUCTIONS) ×1 IMPLANT

## 2024-04-11 NOTE — Discharge Summary (Signed)
 Physician Discharge Summary  Patient ID: Vicki Hooper MRN: [id number] DOB/AGE: Mar 01, 1996 28 y.o.  Admit date: 04/11/2024 Discharge date: 04/11/2024  Admission Diagnoses: IUP at 12 5/7wks History of incompetent cervix   Discharge Diagnoses:  Active Problems:   * No active hospital problems. *   Discharged Condition: stable  Hospital Course: Pt underwent a prophylactic cervical cerclage. She tolerated the procedure with no complications. Fetal heart tones noted before and after procedure  Consults: None  Significant Diagnostic Studies: n/a  Treatments: IV hydration  Discharge Exam: Blood pressure 128/70, pulse 65, temperature 98 F (36.7 C), temperature source Oral, resp. rate 16, height 5\' 6"  (1.676 m), weight 79.4 kg, SpO2 99%, unknown if currently breastfeeding. General appearance: alert, cooperative, and no distress Pelvic: cervix normal in appearance, external genitalia normal, no adnexal masses or tenderness, and vagina normal without discharge Extremities: Homans sign is negative, no sign of DVT  Disposition: Discharge disposition: 01-Home or Self Care       Discharge Instructions     Activity as tolerated - No restrictions   Complete by: As directed    Call MD for:  difficulty breathing, headache or visual disturbances   Complete by: As directed    Call MD for:  persistant nausea and vomiting   Complete by: As directed    Call MD for:  redness, tenderness, or signs of infection (pain, swelling, redness, odor or green/yellow discharge around incision site)   Complete by: As directed    Call MD for:  severe uncontrolled pain   Complete by: As directed    Call MD for:  temperature >100.4   Complete by: As directed    Diet - low sodium heart healthy   Complete by: As directed    Discharge instructions   Complete by: As directed    Call with any concerns [phone]   Driving Restrictions   Complete by: As directed    None for 24 hrs   Lifting  restrictions   Complete by: As directed    As tolerated   Sexual Activity Restrictions   Complete by: As directed    None till next office visit      Allergies as of 04/11/2024   No Known Allergies      Medication List     TAKE these medications    acetaminophen  325 MG tablet Commonly known as: TYLENOL  Take 325 mg by mouth every 6 (six) hours as needed for mild pain (pain score 1-3) or headache.   multivitamin-prenatal 27-0.8 MG Tabs tablet Take 1 tablet by mouth daily at 4 PM.   promethazine  25 MG tablet Commonly known as: PHENERGAN  Take 1 tablet (25 mg total) by mouth every 6 (six) hours as needed for nausea or vomiting.        Follow-up Information     Loa Riling, DO. Go on 04/22/2024.   Specialty: Obstetrics and Gynecology Contact information: [address] [place] [address] [phone]                 Signed: Levis Reams 04/11/2024, 2:51 PM

## 2024-04-11 NOTE — Anesthesia Postprocedure Evaluation (Signed)
 Anesthesia Post Note  Patient: Vicki Hooper  Procedure(s) Performed: CERCLAGE, CERVIX, VAGINAL APPROACH     Patient location during evaluation: PACU Anesthesia Type: Spinal Level of consciousness: oriented and awake and alert Pain management: pain level controlled Vital Signs Assessment: post-procedure vital signs reviewed and stable Respiratory status: spontaneous breathing, respiratory function stable and patient connected to nasal cannula oxygen Cardiovascular status: blood pressure returned to baseline and stable Postop Assessment: no headache, no backache and no apparent nausea or vomiting Anesthetic complications: no   No notable events documented.  Last Vitals:  Vitals:   04/11/24 1545 04/11/24 1600  BP: 115/64 112/66  Pulse: 61 64  Resp: 16 (!) 21  Temp:    SpO2: 97% 95%    Last Pain:  Vitals:   04/11/24 1600  TempSrc:   PainSc: 0-No pain   Pain Goal:                   Rital Cavey

## 2024-04-11 NOTE — Anesthesia Procedure Notes (Signed)
 Spinal  Patient location during procedure: OR Start time: 04/11/2024 1:39 PM End time: 04/11/2024 1:44 PM Reason for block: surgical anesthesia Staffing Performed: other anesthesia staff  Anesthesiologist: Rhenda Cedars, MD Performed by: Rhenda Cedars, MD Authorized by: Rhenda Cedars, MD   Preanesthetic Checklist Completed: patient identified, IV checked, site marked, risks and benefits discussed, surgical consent, monitors and equipment checked, pre-op evaluation and timeout performed Spinal Block Patient position: sitting Prep: DuraPrep Patient monitoring: heart rate, cardiac monitor, continuous pulse ox and blood pressure Approach: midline Location: L4-5 Injection technique: single-shot Needle Needle type: Sprotte  Needle gauge: 24 G Needle length: 9 cm Assessment Sensory level: T4 Events: CSF return

## 2024-04-11 NOTE — Op Note (Addendum)
 Operative Note    Preoperative Diagnosis IUP at 12 5/7wks  History of incompetent cervix   Postoperative Diagnosis: Same   Procedure: Prophylactic cervical cerclage   Surgeon: Arlyne Lame, C DO  Anesthesia: Spinal and 2% lidocaine  with epinephrine   Fluids: LR EBL: 20ml UOP:   Findings: Cervix closed and high. 2.5cm in length.    Specimen: none   Procedure Note  Pt taken to OR and spinal anesthesia administered without complications. Pt placed in dorsal lithotomy position and appropriate time out done. Pt was prepped with betadine , foley catheter placed for bladder decompression and pt draped in sterile fashion. Exam under anesthesia confirmed cervix still closed. Speculum placed and local anesthesia with 2% lidocaine  with epinephrine  injected in a circumferential pattern at 12, 3, 6 and 9 o'clock.  0 mersilene suture placed circumferentially with traction provided using a ring forceps. Knot placed at 12 o'clock. A second suture was placed with knot at 10 oclock but with prolene suture.  Copious irrigation done No active bleeding noted at this time.   All instruments then removed from vagina. Counts correct.  Pt to recovery room in stable condition. Tolerated procedure well Fetal heart tones noted before and after procedure

## 2024-04-11 NOTE — Discharge Instructions (Addendum)
Call with any concerns [phone]

## 2024-04-11 NOTE — Transfer of Care (Signed)
 Immediate Anesthesia Transfer of Care Note  Patient: Vicki Hooper  Procedure(s) Performed: CERCLAGE, CERVIX, VAGINAL APPROACH  Patient Location: PACU  Anesthesia Type:Spinal  Level of Consciousness: awake  Airway & Oxygen Therapy: Patient Spontanous Breathing  Post-op Assessment: Report given to RN and Post -op Vital signs reviewed and stable  Post vital signs: Reviewed and stable  Last Vitals:  Vitals Value Taken Time  BP    Temp    Pulse    Resp    SpO2      Last Pain:  Vitals:   04/11/24 1252  TempSrc:   PainSc: 0-No pain         Complications: No notable events documented.

## 2024-04-11 NOTE — H&P (Signed)
 Vicki Hooper is a 28 y.o.  [phone] female presenting for scheduled prophylactic cervical cerclage. Pt has a history of incompetent cervix. History of 13 week complete ab at home and 25 week svd following PPROM. She had a cervical cerclage with her third pregnancy and made it to term - delivered at 38.6wks.    Past Medical History:  Diagnosis Date   Medical history non-contributory    Past Surgical History:  Procedure Laterality Date   APPENDECTOMY     CERVICAL CERCLAGE N/A 11/23/2022   Procedure: CERCLAGE CERVICAL;  Surgeon: Loa Riling, DO;  Location: [facility] LD ORS;  Service: Gynecology;  Laterality: N/A;   MOUTH SURGERY     WRIST SURGERY Left    Family History: family history includes Diabetes in her maternal grandfather, paternal grandfather, and sister; Hypertension in her maternal grandfather and mother. Social History:  reports that she quit smoking about 8 years ago. Her smoking use included cigarettes. She started smoking about 11 years ago. She has a 0.8 pack-year smoking history. She has never used smokeless tobacco. She reports that she does not drink alcohol and does not use drugs.     Maternal Diabetes: No Genetic Screening: Normal Maternal Ultrasounds/Referrals: Normal Fetal Ultrasounds or other Referrals:  None Maternal Substance Abuse:  No Significant Maternal Medications:  None Significant Maternal Lab Results:  None  Other Comments:  None  Review of Systems  Constitutional:  Negative for activity change and fatigue.  Eyes:  Negative for photophobia and visual disturbance.  Respiratory:  Negative for chest tightness and shortness of breath.   Cardiovascular:  Negative for chest pain, palpitations and leg swelling.  Gastrointestinal:  Negative for abdominal pain.  Genitourinary:  Negative for pelvic pain, vaginal bleeding and vaginal pain.  Musculoskeletal:  Negative for back pain and myalgias.  Neurological:  Negative for light-headedness and  headaches.  Psychiatric/Behavioral:  The patient is not nervous/anxious.    Maternal Medical History:  Reason for admission: Prophylactic cervical cerclage  Prenatal complications: no prenatal complications Prenatal Complications - Diabetes: none.     Pulse 70, temperature 97.8 F (36.6 C), temperature source Oral, resp. rate 18, height 5\' 6"  (1.676 m), weight 79.4 kg, SpO2 99%, unknown if currently breastfeeding. Maternal Exam:  Abdomen: Patient reports no abdominal tenderness. Estimated fetal weight is AGA.   Introitus: Normal vulva. Normal vagina.    Physical Exam Constitutional:      Appearance: Normal appearance. She is normal weight.  Cardiovascular:     Rate and Rhythm: Normal rate.     Pulses: Normal pulses.  Pulmonary:     Effort: Pulmonary effort is normal.  Genitourinary:    General: Normal vulva.  Musculoskeletal:        General: Normal range of motion.     Cervical back: Normal range of motion.  Skin:    General: Skin is warm and dry.     Capillary Refill: Capillary refill takes 2 to 3 seconds.  Neurological:     General: No focal deficit present.     Mental Status: She is alert and oriented to person, place, and time. Mental status is at baseline.  Psychiatric:        Mood and Affect: Mood normal.        Behavior: Behavior normal.        Thought Content: Thought content normal.        Judgment: Judgment normal.     Prenatal labs: ABO, Rh: --/--/PENDING (05/10 1017) Antibody: PENDING (05/10 1017)  Rubella:   RPR: NON REACTIVE (06/22 1157)  HBsAg:    HIV: Non Reactive (06/22 1157)  GBS: Negative/-- (06/04 0000)   Assessment/Plan: [id number] [id number] female presenting for prophylactic cervical cerclage at 44 5/7wks due to history of incompetent cervix - Admit  - ERAS protocol - Verify consent  - To OR when ready    Lorrane Rosette Antia Rahal 04/11/2024, 11:43 AM

## 2024-04-11 NOTE — Anesthesia Preprocedure Evaluation (Signed)
 Anesthesia Evaluation  Patient identified by MRN, date of birth, ID band Patient awake    Reviewed: Allergy & Precautions, NPO status , Patient's Chart, lab work & pertinent test results  Airway Mallampati: II  TM Distance: >3 FB Neck ROM: Full    Dental no notable dental hx. (+) Teeth Intact, Dental Advisory Given   Pulmonary former smoker   Pulmonary exam normal        Cardiovascular negative cardio ROS Normal cardiovascular exam     Neuro/Psych negative neurological ROS  negative psych ROS   GI/Hepatic negative GI ROS, Neg liver ROS,,,  Endo/Other  negative endocrine ROS    Renal/GU negative Renal ROS     Musculoskeletal negative musculoskeletal ROS (+)    Abdominal   Peds  Hematology negative hematology ROS (+)   Anesthesia Other Findings incomplete cervix  Reproductive/Obstetrics                             Anesthesia Physical Anesthesia Plan  ASA: 2  Anesthesia Plan: Spinal   Post-op Pain Management: Minimal or no pain anticipated   Induction: Intravenous  PONV Risk Score and Plan: 2 and Treatment may vary due to age or medical condition  Airway Management Planned: Natural Airway  Additional Equipment: None  Intra-op Plan:   Post-operative Plan:   Informed Consent: I have reviewed the patients History and Physical, chart, labs and discussed the procedure including the risks, benefits and alternatives for the proposed anesthesia with the patient or authorized representative who has indicated his/her understanding and acceptance.     Dental advisory given  Plan Discussed with: CRNA and Anesthesiologist  Anesthesia Plan Comments:        Anesthesia Quick Evaluation

## 2024-04-13 ENCOUNTER — Encounter (HOSPITAL_COMMUNITY): Payer: Self-pay | Admitting: Obstetrics and Gynecology

## 2024-09-21 ENCOUNTER — Encounter (HOSPITAL_COMMUNITY): Payer: Self-pay | Admitting: Obstetrics and Gynecology

## 2024-09-21 ENCOUNTER — Inpatient Hospital Stay (HOSPITAL_COMMUNITY)
Admission: AD | Admit: 2024-09-21 | Discharge: 2024-09-21 | Disposition: A | Discharge status: Home / Self Care | Attending: Obstetrics & Gynecology | Admitting: Obstetrics & Gynecology

## 2024-09-21 DIAGNOSIS — R519 Headache, unspecified: Secondary | ICD-10-CM | POA: Diagnosis present

## 2024-09-21 DIAGNOSIS — O26893 Other specified pregnancy related conditions, third trimester: Secondary | ICD-10-CM

## 2024-09-21 DIAGNOSIS — Z3A36 36 weeks gestation of pregnancy: Secondary | ICD-10-CM

## 2024-09-21 DIAGNOSIS — R030 Elevated blood-pressure reading, without diagnosis of hypertension: Secondary | ICD-10-CM | POA: Diagnosis not present

## 2024-09-21 DIAGNOSIS — O219 Vomiting of pregnancy, unspecified: Secondary | ICD-10-CM | POA: Insufficient documentation

## 2024-09-21 DIAGNOSIS — M7989 Other specified soft tissue disorders: Secondary | ICD-10-CM | POA: Diagnosis present

## 2024-09-21 LAB — CBC
HCT: 33.4 % — ABNORMAL LOW (ref 36.0–46.0)
Hemoglobin: 10.8 g/dL — ABNORMAL LOW (ref 12.0–15.0)
MCH: 29.8 pg (ref 26.0–34.0)
MCHC: 32.3 g/dL (ref 30.0–36.0)
MCV: 92 fL (ref 80.0–100.0)
Platelets: 288 K/uL (ref 150–400)
RBC: 3.63 MIL/uL — ABNORMAL LOW (ref 3.87–5.11)
RDW: 15.9 % — ABNORMAL HIGH (ref 11.5–15.5)
WBC: 16.1 K/uL — ABNORMAL HIGH (ref 4.0–10.5)
nRBC: 0 % (ref 0.0–0.2)

## 2024-09-21 LAB — COMPREHENSIVE METABOLIC PANEL WITH GFR
ALT: 24 U/L (ref 0–44)
AST: 21 U/L (ref 15–41)
Albumin: 2.7 g/dL — ABNORMAL LOW (ref 3.5–5.0)
Alkaline Phosphatase: 124 U/L (ref 38–126)
Anion gap: 11 (ref 5–15)
BUN: 6 mg/dL (ref 6–20)
CO2: 19 mmol/L — ABNORMAL LOW (ref 22–32)
Calcium: 8.9 mg/dL (ref 8.9–10.3)
Chloride: 106 mmol/L (ref 98–111)
Creatinine, Ser: 0.61 mg/dL (ref 0.44–1.00)
GFR, Estimated: >60 mL/min (ref >60)
Glucose, Bld: 77 mg/dL (ref 70–99)
Potassium: 3.7 mmol/L (ref 3.5–5.1)
Sodium: 136 mmol/L (ref 135–145)
Total Bilirubin: 0.6 mg/dL (ref 0.0–1.2)
Total Protein: 6.4 g/dL — ABNORMAL LOW (ref 6.5–8.1)

## 2024-09-21 LAB — URINALYSIS, ROUTINE W REFLEX MICROSCOPIC
Bilirubin Urine: NEGATIVE
Glucose, UA: NEGATIVE mg/dL
Hgb urine dipstick: NEGATIVE
Ketones, ur: NEGATIVE mg/dL
Nitrite: NEGATIVE
Protein, ur: NEGATIVE mg/dL
Specific Gravity, Urine: 1.009 (ref 1.005–1.030)
pH: 7 (ref 5.0–8.0)

## 2024-09-21 LAB — PROTEIN / CREATININE RATIO, URINE
Creatinine, Urine: 43 mg/dL
Protein Creatinine Ratio: 0.19 mg/mg{creat} — ABNORMAL HIGH (ref 0.00–0.15)
Total Protein, Urine: 8 mg/dL

## 2024-09-21 NOTE — MAU Note (Signed)
..  Vicki Hooper is a 28 y.o. at [redacted]w[redacted]d here in MAU reporting: swelling in her BLE for the last 3 days. She reports that she called her OB and they told her to check her BP at home and it was 140s/90s. She also reports nausea and vomiting over the last 3 days as well. Denies visual disturbances, but does have intermittent headaches. Denies headache currently. No VB or LOF. +FM.    Cerclage in place- scheduled for removal on Thursday  Pain score: 0 Vitals:   09/21/24 1857  BP: (!) 144/86  Pulse: 99     FHT: 158 Lab orders placed from triage:   UA

## 2024-09-21 NOTE — Discharge Instructions (Signed)
 Continue to monitor your blood pressure periodically (about once a day) at home unless your OB tells you to check more often.  Reasons to return to MAU at [facility]: Your blood pressure is >160/110. You have a headache that will not go away after having something to eat, something to drink, and Tylenol . You have changes in your vision or upper abdominal pain that does not get better with Tylenol .

## 2024-09-21 NOTE — MAU Provider Note (Signed)
 Chief Complaint:  Hypertension, Nausea, and Emesis   HPI   None     Vicki Hooper is a 28 y.o. [id number] at [redacted]w[redacted]d who presents to maternity admissions reporting bilateral edema and elevated blood pressure.  She reports bilateral edema in her legs for the past 3 days.  When she checked her blood pressure at home around 1430 and it was 140s/90s.  She denies RUQ pain, vision changes.  Endorses intermittent headaches, nausea, and vomiting.  She does not have a headache currently. She has not had elevated BP in the office at all this pregnancy. Denies vaginal bleeding, leaking of fluids.  Endorses good fetal movement.  Pregnancy Course: Receives care at [facility]. Prenatal records reviewed, non available more recently than 08/05/2024.   Past Medical History:  Diagnosis Date   Medical history non-contributory    OB History  Gravida Para Term Preterm AB Living  5 2 1 1 2 2   SAB IAB Ectopic Multiple Live Births  2   0 2    # Outcome Date GA Lbr Len/2nd Weight Sex Type Anes PTL Lv  5 Current           4 Term 05/25/23 [redacted]w[redacted]d 01:46 / 00:05 2920 g F Vag-Spont Local  LIV  3 Preterm 06/15/21 [redacted]w[redacted]d 13:32 / 00:03 780 g F Vag-Spont EPI  LIV  2 SAB 2020 [redacted]w[redacted]d         1 SAB 2017 [redacted]w[redacted]d          Past Surgical History:  Procedure Laterality Date   APPENDECTOMY     CERVICAL CERCLAGE N/A 11/23/2022   Procedure: CERCLAGE CERVICAL;  Surgeon: Delana Ted Morrison, DO;  Location: [facility] LD ORS;  Service: Gynecology;  Laterality: N/A;   CERVICAL CERCLAGE N/A 04/11/2024   Procedure: CERCLAGE, CERVIX, VAGINAL APPROACH;  Surgeon: Delana Ted Morrison, DO;  Location: [facility] LD ORS;  Service: Gynecology;  Laterality: N/A;   MOUTH SURGERY     WRIST SURGERY Left    Family History  Problem Relation Age of Onset   Hypertension Mother    Diabetes Maternal Grandfather    Hypertension Maternal Grandfather    Diabetes Paternal Grandfather    Diabetes Sister    Social History   Tobacco Use   Smoking status:  Former    Current packs/day: 0.00    Average packs/day: 0.3 packs/day for 3.0 years (0.8 ttl pk-yrs)    Types: Cigarettes    Start date: 01/31/2013    Quit date: 02/01/2016    Years since quitting: 8.6   Smokeless tobacco: Never  Vaping Use   Vaping status: Never Used  Substance Use Topics   Alcohol use: No    Alcohol/week: 0.0 standard drinks of alcohol   Drug use: No   No Known Allergies Medications Prior to Admission  Medication Sig Dispense Refill Last Dose/Taking   sertraline  (ZOLOFT ) 50 MG tablet Take 50 mg by mouth daily.   Taking   acetaminophen  (TYLENOL ) 325 MG tablet Take 325 mg by mouth every 6 (six) hours as needed for mild pain (pain score 1-3) or headache.      Prenatal Vit-Fe Fumarate-FA (MULTIVITAMIN-PRENATAL) 27-0.8 MG TABS tablet Take 1 tablet by mouth daily at 4 PM.      promethazine  (PHENERGAN ) 25 MG tablet Take 1 tablet (25 mg total) by mouth every 6 (six) hours as needed for nausea or vomiting. 30 tablet 2     I have reviewed patient's Past Medical Hx, Surgical Hx, Family Hx, Social Hx,  medications and allergies.   ROS  Pertinent items noted in HPI and remainder of comprehensive ROS otherwise negative.   PHYSICAL EXAM  Patient Vitals for the past 24 hrs:  BP Temp Temp src Pulse Resp SpO2  09/21/24 2046 (!) 109/52 -- -- 80 -- --  09/21/24 2045 -- -- -- -- -- 98 %  09/21/24 2040 -- -- -- -- -- 98 %  09/21/24 2035 -- -- -- -- -- 98 %  09/21/24 2031 128/66 -- -- 84 -- --  09/21/24 2030 -- -- -- -- -- 99 %  09/21/24 2025 -- -- -- -- -- 100 %  09/21/24 2016 134/76 -- -- 86 -- --  09/21/24 2015 -- -- -- -- -- 96 %  09/21/24 2010 -- -- -- -- -- 97 %  09/21/24 2005 -- -- -- -- -- 97 %  09/21/24 2001 121/77 -- -- 90 -- --  09/21/24 2000 -- -- -- -- -- 97 %  09/21/24 1950 -- -- -- -- -- 97 %  09/21/24 1946 130/77 -- -- 90 -- --  09/21/24 1945 -- -- -- -- -- 96 %  09/21/24 1940 -- -- -- -- -- 97 %  09/21/24 1935 -- -- -- -- -- 97 %  09/21/24 1931 138/77 -- --  78 -- --  09/21/24 1930 -- -- -- -- -- 97 %  09/21/24 1925 -- -- -- -- -- 97 %  09/21/24 1920 -- -- -- -- -- 96 %  09/21/24 1916 115/67 -- -- 88 -- --  09/21/24 1915 -- -- -- -- -- 97 %  09/21/24 1910 -- -- -- -- -- 97 %  09/21/24 1905 -- -- -- -- -- 97 %  09/21/24 1903 (!) 141/76 -- -- 87 -- 97 %  09/21/24 1857 (!) 144/86 98.2 F (36.8 C) Oral 99 16 --    Constitutional: Well-developed, well-nourished female in no acute distress.  HEENT: atraumatic, normocephalic. Neck has normal ROM. EOM intact. Cardiovascular: normal rate & rhythm, warm and well-perfused Respiratory: normal effort, no problems with respiration noted GI: Abd soft, non-tender, non-distended MSK: Extremities nontender, no edema, normal ROM Skin: warm and dry. Acyanotic, no jaundice or pallor. Neurologic: Alert and oriented x 4. No abnormal coordination. Psychiatric: Normal mood. Speech not slurred, not rapid/pressured. Patient is cooperative. GU: no CVA tenderness  Fetal Tracing: Baseline FHR: 125 per minute Fetal heart variability: moderate Fetal Heart Rate accelerations: yes Fetal Heart Rate decelerations: none Fetal Non-stress Test: Category I (reactive) Toco: uterine irritability   Labs: Results for orders placed or performed during the hospital encounter of 09/21/24 (from the past 24 hours)  Urinalysis, Routine w reflex microscopic -Urine, Clean Catch     Status: Abnormal   Collection Time: 09/21/24  6:54 PM  Result Value Ref Range   Color, Urine YELLOW YELLOW   APPearance HAZY (A) CLEAR   Specific Gravity, Urine 1.009 1.005 - 1.030   pH 7.0 5.0 - 8.0   Glucose, UA NEGATIVE NEGATIVE mg/dL   Hgb urine dipstick NEGATIVE NEGATIVE   Bilirubin Urine NEGATIVE NEGATIVE   Ketones, ur NEGATIVE NEGATIVE mg/dL   Protein, ur NEGATIVE NEGATIVE mg/dL   Nitrite NEGATIVE NEGATIVE   Leukocytes,Ua TRACE (A) NEGATIVE   RBC / HPF 0-5 0 - 5 RBC/hpf   WBC, UA 0-5 0 - 5 WBC/hpf   Bacteria, UA FEW (A) NONE SEEN    Squamous Epithelial / HPF 11-20 0 - 5 /HPF   Mucus PRESENT   Protein / creatinine  ratio, urine     Status: Abnormal   Collection Time: 09/21/24  6:54 PM  Result Value Ref Range   Creatinine, Urine 43 mg/dL   Total Protein, Urine 8 mg/dL   Protein Creatinine Ratio 0.19 (H) 0.00 - 0.15 mg/mg[Cre]  CBC     Status: Abnormal   Collection Time: 09/21/24  7:52 PM  Result Value Ref Range   WBC 16.1 (H) 4.0 - 10.5 K/uL   RBC 3.63 (L) 3.87 - 5.11 MIL/uL   Hemoglobin 10.8 (L) 12.0 - 15.0 g/dL   HCT 66.5 (L) 63.9 - 53.9 %   MCV 92.0 80.0 - 100.0 fL   MCH 29.8 26.0 - 34.0 pg   MCHC 32.3 30.0 - 36.0 g/dL   RDW 84.0 (H) 88.4 - 84.4 %   Platelets 288 150 - 400 K/uL   nRBC 0.0 0.0 - 0.2 %  Comprehensive metabolic panel with GFR     Status: Abnormal   Collection Time: 09/21/24  7:52 PM  Result Value Ref Range   Sodium 136 135 - 145 mmol/L   Potassium 3.7 3.5 - 5.1 mmol/L   Chloride 106 98 - 111 mmol/L   CO2 19 (L) 22 - 32 mmol/L   Glucose, Bld 77 70 - 99 mg/dL   BUN 6 6 - 20 mg/dL   Creatinine, Ser 9.38 0.44 - 1.00 mg/dL   Calcium  8.9 8.9 - 10.3 mg/dL   Total Protein 6.4 (L) 6.5 - 8.1 g/dL   Albumin 2.7 (L) 3.5 - 5.0 g/dL   AST 21 15 - 41 U/L   ALT 24 0 - 44 U/L   Alkaline Phosphatase 124 38 - 126 U/L   Total Bilirubin 0.6 0.0 - 1.2 mg/dL   GFR, Estimated >39 >39 mL/min   Anion gap 11 5 - 15    Imaging:  No results found.  MDM & MAU COURSE  MDM: High  MAU Course: -Elevated BP. -CMP, CBC, urine protein/creatinine ratio to rule out preeclampsia.  -CBC with anemia, no thrombocytopenia. -CMP within normal limits for pregnancy. -urine protein/creatinine ratio <0.3. -Other than initial elevated values, other BP values have been within normal limits <140/90. Does not meet diagnostic criteria for gestational hypertension at this time as elevated BP readings were less than 4 hour apart today.  Differential diagnosis considered for elevated blood pressure includes but is not limited to:  high risk conditions like preeclampsia or gestational hypertension; chronic hypertension; transient spurious elevated blood pressure   Orders Placed This Encounter  Procedures   Culture, OB Urine   Urinalysis, Routine w reflex microscopic -Urine, Clean Catch   CBC   Comprehensive metabolic panel with GFR   Protein / creatinine ratio, urine   Discharge patient   No orders of the defined types were placed in this encounter.   ASSESSMENT   1. Elevated blood pressure reading without diagnosis of hypertension   2. Nausea and vomiting during pregnancy   3. [redacted] weeks gestation of pregnancy     PLAN  Discharge home in stable condition with preeclampsia precautions.  Follow up with [facility] on 09/24/2024 as scheduled.    Follow-up Information     [facility] Follow up.   Why: As scheduled for ongoing prenatal care Contact information: [address]  [address] [place] [address] [phone]                  Allergies as of 09/21/2024   No Known Allergies  Medication List     TAKE these medications    acetaminophen  325 MG tablet Commonly known as: TYLENOL  Take 325 mg by mouth every 6 (six) hours as needed for mild pain (pain score 1-3) or headache.   multivitamin-prenatal 27-0.8 MG Tabs tablet Take 1 tablet by mouth daily at 4 PM.   promethazine  25 MG tablet Commonly known as: PHENERGAN  Take 1 tablet (25 mg total) by mouth every 6 (six) hours as needed for nausea or vomiting.   sertraline  50 MG tablet Commonly known as: ZOLOFT  Take 50 mg by mouth daily.        Joesph DELENA Sear, PA

## 2024-09-23 LAB — CULTURE, OB URINE: Culture: <10000 — AB

## 2024-09-25 LAB — OB RESULTS CONSOLE GBS: GBS: NEGATIVE

## 2024-10-13 ENCOUNTER — Encounter (HOSPITAL_COMMUNITY): Payer: Self-pay

## 2024-10-13 ENCOUNTER — Inpatient Hospital Stay (HOSPITAL_COMMUNITY)
Admission: AD | Admit: 2024-10-13 | Discharge: 2024-10-15 | DRG: 807 | Disposition: A | Attending: Obstetrics and Gynecology | Admitting: Obstetrics and Gynecology

## 2024-10-13 ENCOUNTER — Inpatient Hospital Stay (HOSPITAL_COMMUNITY): Admitting: Anesthesiology

## 2024-10-13 ENCOUNTER — Other Ambulatory Visit: Payer: Self-pay

## 2024-10-13 ENCOUNTER — Encounter (HOSPITAL_COMMUNITY): Payer: Self-pay | Admitting: Obstetrics and Gynecology

## 2024-10-13 DIAGNOSIS — Z833 Family history of diabetes mellitus: Secondary | ICD-10-CM

## 2024-10-13 DIAGNOSIS — O99344 Other mental disorders complicating childbirth: Secondary | ICD-10-CM | POA: Diagnosis present

## 2024-10-13 DIAGNOSIS — Z3490 Encounter for supervision of normal pregnancy, unspecified, unspecified trimester: Principal | ICD-10-CM

## 2024-10-13 DIAGNOSIS — Z3A39 39 weeks gestation of pregnancy: Secondary | ICD-10-CM | POA: Diagnosis not present

## 2024-10-13 DIAGNOSIS — Z87891 Personal history of nicotine dependence: Secondary | ICD-10-CM

## 2024-10-13 DIAGNOSIS — Z8249 Family history of ischemic heart disease and other diseases of the circulatory system: Secondary | ICD-10-CM

## 2024-10-13 DIAGNOSIS — O26893 Other specified pregnancy related conditions, third trimester: Secondary | ICD-10-CM | POA: Diagnosis present

## 2024-10-13 DIAGNOSIS — F419 Anxiety disorder, unspecified: Secondary | ICD-10-CM | POA: Diagnosis present

## 2024-10-13 LAB — TYPE AND SCREEN
ABO/RH(D): O POS
Antibody Screen: NEGATIVE

## 2024-10-13 LAB — CBC
HCT: 33.2 % — ABNORMAL LOW (ref 36.0–46.0)
Hemoglobin: 10.5 g/dL — ABNORMAL LOW (ref 12.0–15.0)
MCH: 28.4 pg (ref 26.0–34.0)
MCHC: 31.6 g/dL (ref 30.0–36.0)
MCV: 89.7 fL (ref 80.0–100.0)
Platelets: 275 K/uL (ref 150–400)
RBC: 3.7 MIL/uL — ABNORMAL LOW (ref 3.87–5.11)
RDW: 15.7 % — ABNORMAL HIGH (ref 11.5–15.5)
WBC: 13.1 K/uL — ABNORMAL HIGH (ref 4.0–10.5)
nRBC: 0 % (ref 0.0–0.2)

## 2024-10-13 LAB — HIV ANTIBODY (ROUTINE TESTING W REFLEX): HIV Screen 4th Generation wRfx: NONREACTIVE

## 2024-10-13 MED ORDER — LACTATED RINGERS IV SOLN: INTRAVENOUS | Status: DC

## 2024-10-13 MED ORDER — ONDANSETRON HCL 4 MG/2ML IJ SOLN
4.0000 mg | Freq: Four times a day (QID) | INTRAMUSCULAR | Status: DC | PRN
  Administered 2024-10-14: 4 mg via INTRAVENOUS
  Filled 2024-10-13: qty 2

## 2024-10-13 MED ORDER — ACETAMINOPHEN 325 MG PO TABS: 650.0000 mg | ORAL_TABLET | ORAL | Status: DC | PRN

## 2024-10-13 MED ORDER — OXYCODONE-ACETAMINOPHEN 5-325 MG PO TABS: 2.0000 | ORAL_TABLET | ORAL | Status: DC | PRN

## 2024-10-13 MED ORDER — LIDOCAINE HCL (PF) 1 % IJ SOLN
INTRAMUSCULAR | Status: DC | PRN
  Administered 2024-10-13: 8 mL via EPIDURAL

## 2024-10-13 MED ORDER — MISOPROSTOL 50MCG HALF TABLET
50.0000 ug | ORAL_TABLET | ORAL | Status: DC | PRN
  Administered 2024-10-13: 50 ug via VAGINAL
  Filled 2024-10-13: qty 1

## 2024-10-13 MED ORDER — OXYTOCIN BOLUS FROM INFUSION
333.0000 mL | Freq: Once | INTRAVENOUS | Status: AC
  Administered 2024-10-14: 333 mL via INTRAVENOUS

## 2024-10-13 MED ORDER — SOD CITRATE-CITRIC ACID 500-334 MG/5ML PO SOLN
30.0000 mL | ORAL | Status: DC | PRN
Start: 2024-10-13 — End: 2024-10-14

## 2024-10-13 MED ORDER — OXYTOCIN-SODIUM CHLORIDE 30-0.9 UT/500ML-% IV SOLN
2.5000 [IU]/h | INTRAVENOUS | Status: DC
  Administered 2024-10-14: 2.5 [IU]/h via INTRAVENOUS

## 2024-10-13 MED ORDER — FENTANYL CITRATE (PF) 100 MCG/2ML IJ SOLN: 50.0000 ug | INTRAMUSCULAR | Status: DC | PRN

## 2024-10-13 MED ORDER — DIPHENHYDRAMINE HCL 50 MG/ML IJ SOLN: 12.5000 mg | INTRAMUSCULAR | Status: DC | PRN

## 2024-10-13 MED ORDER — TERBUTALINE SULFATE 1 MG/ML IJ SOLN: 0.2500 mg | Freq: Once | INTRAMUSCULAR | Status: DC | PRN

## 2024-10-13 MED ORDER — EPHEDRINE 5 MG/ML INJ
10.0000 mg | INTRAVENOUS | Status: DC | PRN
Start: 2024-10-13 — End: 2024-10-14

## 2024-10-13 MED ORDER — OXYTOCIN-SODIUM CHLORIDE 30-0.9 UT/500ML-% IV SOLN
1.0000 m[IU]/min | INTRAVENOUS | Status: DC
  Administered 2024-10-13: 2 m[IU]/min via INTRAVENOUS
  Filled 2024-10-13: qty 500

## 2024-10-13 MED ORDER — FENTANYL-BUPIVACAINE-NACL 0.5-0.125-0.9 MG/250ML-% EP SOLN
12.0000 mL/h | EPIDURAL | Status: DC | PRN
  Administered 2024-10-13: 12 mL/h via EPIDURAL
  Filled 2024-10-13: qty 250

## 2024-10-13 MED ORDER — LACTATED RINGERS IV SOLN
500.0000 mL | INTRAVENOUS | Status: DC | PRN
  Administered 2024-10-13: 500 mL via INTRAVENOUS

## 2024-10-13 MED ORDER — OXYCODONE-ACETAMINOPHEN 5-325 MG PO TABS: 1.0000 | ORAL_TABLET | ORAL | Status: DC | PRN

## 2024-10-13 MED ORDER — OXYTOCIN-SODIUM CHLORIDE 30-0.9 UT/500ML-% IV SOLN: 1.0000 m[IU]/min | INTRAVENOUS | Status: DC

## 2024-10-13 MED ORDER — PHENYLEPHRINE 80 MCG/ML (10ML) SYRINGE FOR IV PUSH (FOR BLOOD PRESSURE SUPPORT)
80.0000 ug | PREFILLED_SYRINGE | INTRAVENOUS | Status: DC | PRN
Start: 2024-10-13 — End: 2024-10-14

## 2024-10-13 MED ORDER — EPHEDRINE 5 MG/ML INJ: 10.0000 mg | INTRAVENOUS | Status: DC | PRN

## 2024-10-13 MED ORDER — PHENYLEPHRINE 80 MCG/ML (10ML) SYRINGE FOR IV PUSH (FOR BLOOD PRESSURE SUPPORT): 80.0000 ug | PREFILLED_SYRINGE | INTRAVENOUS | Status: DC | PRN

## 2024-10-13 MED ORDER — LACTATED RINGERS IV SOLN
500.0000 mL | Freq: Once | INTRAVENOUS | Status: AC
  Administered 2024-10-13: 500 mL via INTRAVENOUS

## 2024-10-13 MED ORDER — LIDOCAINE HCL (PF) 1 % IJ SOLN: 30.0000 mL | INTRAMUSCULAR | Status: DC | PRN

## 2024-10-13 NOTE — Progress Notes (Signed)
 Vicki Hooper is a 28 y.o. [id number] at [redacted]w[redacted]d  Subjective: Pt is tolerating contractions well. Does not desire any pain relief medication. +Fms  Objective: BP 134/79   Pulse 83   Temp 98 F (36.7 C) (Oral)   Resp 15   Ht 5' 6 (1.676 m)   Wt 104.3 kg   BMI 37.12 kg/m  No intake/output data recorded. No intake/output data recorded.  FHT:  FHR: 140 bpm, variability: moderate,  accelerations:  Present,  decelerations:  Absent UC:   irregular, every 5 minutes SVE:   Dilation: Closed Station: -3 Exam by:: Mady Pals, RN  Labs: Lab Results  Component Value Date   WBC 13.1 (H) 10/13/2024   HGB 10.5 (L) 10/13/2024   HCT 33.2 (L) 10/13/2024   MCV 89.7 10/13/2024   PLT 275 10/13/2024    Assessment / Plan: [id number] at 39 2/7wks for elective IOL  S/P cytotec x 1  Labor: 3/60/-3 - AROM with clear fluid noted  Preeclampsia:  n/a Fetal Wellbeing:  Category I Pain Control:  Labor support without medications I/D:  n/a Anticipated MOD:  NSVD  Ted LELON Solo, DO 10/13/2024, 2:14 PM

## 2024-10-13 NOTE — Progress Notes (Signed)
 Patient ID: Vicki Hooper, female   DOB: 1996-10-31, 28 y.o.   MRN: [id number] Pt comfortable with epidural VSS EFM - 135, mod variability , cat 1 TOCO - ctxs q 2-48mins  SVE = 5.5/90/-2  A/P: Progressing well, on pitocin          IUPC placed         Expectant mgmt

## 2024-10-13 NOTE — Progress Notes (Signed)
 Patient ID: Vicki Hooper, female   DOB: 1995-12-10, 28 y.o.   MRN: [id number] Pt tolerating contractions well  VSS EFM 125, cat 1 TOCO - irreg contractions on pitocin   SVE 4.5/80/-2  A/P: Progressing well; still in latent labor          Pitocin  per protocol          Pain relief per pt request          Anticipate svd

## 2024-10-13 NOTE — Anesthesia Preprocedure Evaluation (Signed)
 Anesthesia Evaluation  Patient identified by MRN, date of birth, ID band Patient awake    Reviewed: Allergy & Precautions, Patient's Chart, lab work & pertinent test results  Airway Mallampati: II  TM Distance: >3 FB Neck ROM: Full    Dental no notable dental hx.    Pulmonary neg pulmonary ROS, former smoker   Pulmonary exam normal breath sounds clear to auscultation       Cardiovascular negative cardio ROS Normal cardiovascular exam Rhythm:Regular Rate:Normal     Neuro/Psych negative neurological ROS  negative psych ROS   GI/Hepatic negative GI ROS, Neg liver ROS,,,  Endo/Other  BMI 37  Renal/GU negative Renal ROS  negative genitourinary   Musculoskeletal negative musculoskeletal ROS (+)    Abdominal  (+) + obese  Peds negative pediatric ROS (+)  Hematology negative hematology ROS (+) Hb 10.5, plt 275   Anesthesia Other Findings   Reproductive/Obstetrics (+) Pregnancy S/p cerclage and removal this pregnancy                              Anesthesia Physical Anesthesia Plan  ASA: 2  Anesthesia Plan: Epidural   Post-op Pain Management:    Induction:   PONV Risk Score and Plan: 2  Airway Management Planned: Natural Airway  Additional Equipment: None  Intra-op Plan:   Post-operative Plan:   Informed Consent: I have reviewed the patients History and Physical, chart, labs and discussed the procedure including the risks, benefits and alternatives for the proposed anesthesia with the patient or authorized representative who has indicated his/her understanding and acceptance.       Plan Discussed with:   Anesthesia Plan Comments:         Anesthesia Quick Evaluation

## 2024-10-13 NOTE — H&P (Signed)
 Vicki Hooper is a 28 y.o. [phone] female presenting for elective IOL at 74 1/7wks. Her pregnancy has been complicated by: History of incompetent cervix - she had a cerclage at 12 weeks ; removed at 36 weeks. ( Hx pf 13wk sab at home and 25week preterm delivery following pprom with previous pregnancies)  UTI in first trimester - resolved  Anxiety disorder - on 50mg  sertraline  every day NIPT expected range. Received flu/RSV and Tdap. GBS neg.  OB History     Gravida  5   Para  2   Term  1   Preterm  1   AB  2   Living  2      SAB  2   IAB      Ectopic      Multiple  0   Live Births  2          Past Medical History:  Diagnosis Date   Medical history non-contributory    Past Surgical History:  Procedure Laterality Date   APPENDECTOMY     CERVICAL CERCLAGE N/A 11/23/2022   Procedure: CERCLAGE CERVICAL;  Surgeon: Delana Ted Morrison, DO;  Location: [facility] LD [facility]S;  Service: Gynecology;  Laterality: N/A;   CERVICAL CERCLAGE N/A 04/11/2024   Procedure: CERCLAGE, CERVIX, VAGINAL APPROACH;  Surgeon: Delana Ted Morrison, DO;  Location: [facility] LD ORS;  Service: Gynecology;  Laterality: N/A;   MOUTH SURGERY     WRIST SURGERY Left    Family History: family history includes Diabetes in her maternal grandfather, paternal grandfather, and sister; Hypertension in her maternal grandfather and mother. Social History:  reports that she quit smoking about 8 years ago. Her smoking use included cigarettes. She started smoking about 11 years ago. She has a 0.8 pack-year smoking history. She has never used smokeless tobacco. She reports that she does not drink alcohol and does not use drugs.     Maternal Diabetes: No Genetic Screening: Normal Maternal Ultrasounds/Referrals: Normal Fetal Ultrasounds or other Referrals:  None Maternal Substance Abuse:  No Significant Maternal Medications:  None Significant Maternal Lab Results:  Group B Strep negative Number of Prenatal Visits:greater  than 3 verified prenatal visits Maternal Vaccinations:RSV: Given during pregnancy >/=14 days ago, TDap, and Flu Other Comments:  None  Review of Systems  Constitutional:  Positive for activity change and fatigue.  Eyes:  Negative for photophobia and visual disturbance.  Respiratory:  Negative for chest tightness and shortness of breath.   Cardiovascular:  Positive for leg swelling. Negative for chest pain and palpitations.  Gastrointestinal:  Negative for abdominal pain.  Endocrine: Positive for polydipsia.  Genitourinary:  Positive for pelvic pain. Negative for vaginal bleeding.  Musculoskeletal:  Positive for joint swelling. Negative for back pain.  Neurological:  Negative for light-headedness and headaches.  Psychiatric/Behavioral:  The patient is nervous/anxious.    Maternal Medical History:  Reason for admission: Elective IOL   Contractions: Frequency: rare.   Perceived severity is mild.   Fetal activity: Perceived fetal activity is normal.   Prenatal complications: no prenatal complications Prenatal Complications - Diabetes: none.     unknown if currently breastfeeding. Maternal Exam:  Uterine Assessment: Contraction strength is mild.  Contraction frequency is rare.  Abdomen: Patient reports generalized tenderness.  Estimated fetal weight is AGA.   Fetal presentation: vertex Introitus: Normal vulva. Vulva is negative for condylomata and lesion.  Normal vagina.  Vagina is negative for condylomata.  Pelvis: adequate for delivery.   Cervix: Cervix evaluated by digital exam.  Fetal Exam Fetal Monitor Review: Baseline rate: 140.  Variability: moderate (6-25 bpm).   Pattern: accelerations present and no decelerations.   Fetal State Assessment: Category I - tracings are normal.   Physical Exam Constitutional:      Appearance: Normal appearance. She is normal weight.  Cardiovascular:     Rate and Rhythm: Normal rate.     Pulses: Normal pulses.  Pulmonary:      Effort: Pulmonary effort is normal.  Abdominal:     Tenderness: There is generalized abdominal tenderness.  Genitourinary:    General: Normal vulva.  Vulva is no lesion.  Musculoskeletal:        General: Normal range of motion.     Cervical back: Normal range of motion.  Skin:    General: Skin is warm and dry.     Capillary Refill: Capillary refill takes 2 to 3 seconds.  Neurological:     General: No focal deficit present.     Mental Status: She is alert and oriented to person, place, and time. Mental status is at baseline.  Psychiatric:        Mood and Affect: Mood normal.        Behavior: Behavior normal.        Thought Content: Thought content normal.        Judgment: Judgment normal.     Prenatal labs: ABO, Rh: --/--/O POS (05/10 1017) Antibody: NEG (05/10 1017) Rubella:   RPR: NON REACTIVE (05/10 1017)  HBsAg:    HIV:    GBS:     Assessment/Plan: 67bn [id number] female at 18 1/7wks here for eIOL - hx incompetent cervix - Admitted  - GBS neg - Plan cytotec for IOL ; pitocin  if indicated to follow vs AROM  - Pain control prn  - Anticipate svd   Ted ORN Zurri Rudden 10/13/2024, 9:11 AM

## 2024-10-13 NOTE — Anesthesia Procedure Notes (Signed)
 Epidural Patient location during procedure: OB Start time: 10/13/2024 8:58 PM End time: 10/13/2024 9:07 PM  Staffing Anesthesiologist: Merla Almarie HERO, DO Performed: anesthesiologist   Preanesthetic Checklist Completed: patient identified, IV checked, risks and benefits discussed, monitors and equipment checked, pre-op evaluation and timeout performed  Epidural Patient position: sitting Prep: DuraPrep and site prepped and draped Patient monitoring: continuous pulse ox, blood pressure, heart rate and cardiac monitor Approach: midline Location: L3-L4 Injection technique: LOR air  Needle:  Needle type: Tuohy  Needle gauge: 17 G Needle length: 9 cm Needle insertion depth: 6 cm Catheter type: closed end flexible Catheter size: 19 Gauge Catheter at skin depth: 11 cm Test dose: negative  Assessment Sensory level: T8 Events: blood not aspirated, no cerebrospinal fluid, injection not painful, no injection resistance, no paresthesia and negative IV test  Additional Notes Patient identified. Risks/Benefits/Options discussed with patient including but not limited to bleeding, infection, nerve damage, paralysis, failed block, incomplete pain control, headache, blood pressure changes, nausea, vomiting, reactions to medication both or allergic, itching and postpartum back pain. Confirmed with bedside nurse the patient's most recent platelet count. Confirmed with patient that they are not currently taking any anticoagulation, have any bleeding history or any family history of bleeding disorders. Patient expressed understanding and wished to proceed. All questions were answered. Sterile technique was used throughout the entire procedure. Please see nursing notes for vital signs. Test dose was given through epidural catheter and negative prior to continuing to dose epidural or start infusion. Warning signs of high block given to the patient including shortness of breath, tingling/numbness in  hands, complete motor block, or any concerning symptoms with instructions to call for help. Patient was given instructions on fall risk and not to get out of bed. All questions and concerns addressed with instructions to call with any issues or inadequate analgesia.  Reason for block:procedure for pain

## 2024-10-14 ENCOUNTER — Encounter (HOSPITAL_COMMUNITY): Payer: Self-pay | Admitting: Obstetrics and Gynecology

## 2024-10-14 LAB — CBC
HCT: 32.7 % — ABNORMAL LOW (ref 36.0–46.0)
Hemoglobin: 10.3 g/dL — ABNORMAL LOW (ref 12.0–15.0)
MCH: 28.3 pg (ref 26.0–34.0)
MCHC: 31.5 g/dL (ref 30.0–36.0)
MCV: 89.8 fL (ref 80.0–100.0)
Platelets: 245 K/uL (ref 150–400)
RBC: 3.64 MIL/uL — ABNORMAL LOW (ref 3.87–5.11)
RDW: 15.5 % (ref 11.5–15.5)
WBC: 21.1 K/uL — ABNORMAL HIGH (ref 4.0–10.5)
nRBC: 0 % (ref 0.0–0.2)

## 2024-10-14 LAB — RPR: RPR Ser Ql: NONREACTIVE

## 2024-10-14 MED ORDER — WITCH HAZEL-GLYCERIN EX PADS: 1.0000 | MEDICATED_PAD | CUTANEOUS | Status: DC | PRN

## 2024-10-14 MED ORDER — ONDANSETRON HCL 4 MG PO TABS: 4.0000 mg | ORAL_TABLET | ORAL | Status: DC | PRN

## 2024-10-14 MED ORDER — SERTRALINE HCL 50 MG PO TABS
50.0000 mg | ORAL_TABLET | Freq: Every day | ORAL | Status: DC
  Administered 2024-10-14 – 2024-10-15 (×2): 50 mg via ORAL
  Filled 2024-10-14 (×2): qty 1

## 2024-10-14 MED ORDER — BENZOCAINE-MENTHOL 20-0.5 % EX AERO
1.0000 | INHALATION_SPRAY | CUTANEOUS | Status: DC | PRN
  Administered 2024-10-14: 1 via TOPICAL
  Filled 2024-10-14: qty 56

## 2024-10-14 MED ORDER — IBUPROFEN 600 MG PO TABS
600.0000 mg | ORAL_TABLET | Freq: Four times a day (QID) | ORAL | Status: DC
  Administered 2024-10-14 – 2024-10-15 (×6): 600 mg via ORAL
  Filled 2024-10-14 (×6): qty 1

## 2024-10-14 MED ORDER — ACETAMINOPHEN 325 MG PO TABS: 650.0000 mg | ORAL_TABLET | ORAL | Status: DC | PRN

## 2024-10-14 MED ORDER — ONDANSETRON HCL 4 MG/2ML IJ SOLN: 4.0000 mg | INTRAMUSCULAR | Status: DC | PRN

## 2024-10-14 MED ORDER — TETANUS-DIPHTH-ACELL PERTUSSIS 5-2-15.5 LF-MCG/0.5 IM SUSP: 0.5000 mL | Freq: Once | INTRAMUSCULAR | Status: DC

## 2024-10-14 MED ORDER — DIBUCAINE (PERIANAL) 1 % EX OINT: 1.0000 | TOPICAL_OINTMENT | CUTANEOUS | Status: DC | PRN

## 2024-10-14 MED ORDER — SIMETHICONE 80 MG PO CHEW: 80.0000 mg | CHEWABLE_TABLET | ORAL | Status: DC | PRN

## 2024-10-14 MED ORDER — PRENATAL MULTIVITAMIN CH
1.0000 | ORAL_TABLET | Freq: Every day | ORAL | Status: DC
  Administered 2024-10-14 – 2024-10-15 (×2): 1 via ORAL
  Filled 2024-10-14 (×2): qty 1

## 2024-10-14 MED ORDER — SENNOSIDES-DOCUSATE SODIUM 8.6-50 MG PO TABS
2.0000 | ORAL_TABLET | Freq: Every day | ORAL | Status: DC
  Administered 2024-10-15: 2 via ORAL
  Filled 2024-10-14: qty 2

## 2024-10-14 MED ORDER — DIPHENHYDRAMINE HCL 25 MG PO CAPS: 25.0000 mg | ORAL_CAPSULE | Freq: Four times a day (QID) | ORAL | Status: DC | PRN

## 2024-10-14 MED ORDER — OXYCODONE HCL 5 MG PO TABS: 5.0000 mg | ORAL_TABLET | ORAL | Status: DC | PRN

## 2024-10-14 MED ORDER — ZOLPIDEM TARTRATE 5 MG PO TABS: 5.0000 mg | ORAL_TABLET | Freq: Every evening | ORAL | Status: DC | PRN

## 2024-10-14 MED ORDER — OXYCODONE HCL 5 MG PO TABS: 10.0000 mg | ORAL_TABLET | ORAL | Status: DC | PRN

## 2024-10-14 MED ORDER — COCONUT OIL OIL: 1.0000 | TOPICAL_OIL | Status: DC | PRN

## 2024-10-14 NOTE — Anesthesia Postprocedure Evaluation (Signed)
 Anesthesia Post Note  Patient: Vicki Hooper  Procedure(s) Performed: AN AD HOC LABOR EPIDURAL     Patient location during evaluation: Mother Baby Anesthesia Type: Epidural Level of consciousness: awake and alert and oriented Pain management: satisfactory to patient Vital Signs Assessment: post-procedure vital signs reviewed and stable Respiratory status: respiratory function stable Cardiovascular status: stable Postop Assessment: no headache, no backache, epidural receding, patient able to bend at knees, no signs of nausea or vomiting, adequate PO intake and able to ambulate Anesthetic complications: no   No notable events documented.  Last Vitals:  Vitals:   10/14/24 0410 10/14/24 0559  BP: 123/67 127/78  Pulse: 65 64  Resp: 18 19  Temp: 36.8 C   SpO2: 98% 98%    Last Pain:  Vitals:   10/14/24 0605  TempSrc:   PainSc: 0-No pain   Pain Goal:                   [name]

## 2024-10-14 NOTE — Progress Notes (Signed)
 Patient is doing well.  She is ambulating, voiding, tolerating PO.  Pain control is good.  Lochia is appropriate  Vitals:   10/14/24 0230 10/14/24 0304 10/14/24 0410 10/14/24 0559  BP: 126/64 121/72 123/67 127/78  Pulse: 71 67 65 64  Resp:  18 18 19   Temp:  98.4 F (36.9 C) 98.3 F (36.8 C)   TempSrc:  Oral Oral Oral  SpO2:  99% 98% 98%  Weight:      Height:        NAD Fundus firm Ext: trace edema  Lab Results  Component Value Date   WBC 21.1 (H) 10/14/2024   HGB 10.3 (L) 10/14/2024   HCT 32.7 (L) 10/14/2024   MCV 89.8 10/14/2024   PLT 245 10/14/2024    --/--/O POS (11/11 9060)  A/P 28 y.o. [id number] PPD#0. Routine postpartum care.   Anxiety--continue home sertraline  Expect d/c 1-2 days     [facility] GEFFEL Ellianna Ruest

## 2024-10-14 NOTE — Lactation Note (Addendum)
 This note was copied from a baby's chart. Lactation Consultation Note  Patient Name: Vicki Hooper Unijb'd Date: 10/14/2024 Age:30 hours- Cleft Lip  Reason for consult: Initial assessment;Term;Breastfeeding assistance As LC entered the room mom was attempting to feed the bottle due to 2 low blood sugars of 37 and 35.  Lab into draw the blood sugar.  LC recommended due to the baby not taking the bottle well to see if the baby would latch, since she was wide awake and mom has excellent colostrum flow. The baby opened enough to latch and did not suck, and then it was on/ off. Baby was unable to sustain the latch.  LC turned the baby on her side to see if baby would to better with the extra slow flow nipple and baby took a few sucks, and off, baby is unable to sustain a latch on the bottle.  LC recommended to mom to keep trying and the [facility] would report to her nurse and see about SLP consult for a different nipple.  LC reported to [name] the dyad's nurse and the SLP [facility] and Epic chat LC and LC responded promptly and recommended the dyad to be seen soon to assess the nipple due to the low blood sugars.   LC recommended to the nurse if the blood sugars and feedings continue to be difficult a DEBP should be set up. ( Mom aware ) and LC provided a small bottle for hand expressing in the mean time.  Blood sugar ( Serum at 0828 ) came back at 46  Maternal Data Has patient been taught Hand Expression?: Yes Does the patient have breastfeeding experience prior to this delivery?: Yes How long did the patient breastfeed?: per mom only ended up pumping for 3-4 months due to 1st baby being in NICU and 2nd baby DL  Feeding Mother's Current Feeding Choice: Breast Milk and Formula Nipple Type: Extra Slow Flow  LATCH Score Latch: Repeated attempts needed to sustain latch, nipple held in mouth throughout feeding, stimulation needed to elicit sucking reflex.  Audible Swallowing: None  Type of Nipple:  Everted at rest and after stimulation  Comfort (Breast/Nipple): Soft / non-tender  Hold (Positioning): Assistance needed to correctly position infant at breast and maintain latch.  LATCH Score: 6   Lactation Tools Discussed/Used    Interventions Interventions: Breast feeding basics reviewed;Assisted with latch;Skin to skin;Breast massage;Hand express;Adjust position;Support pillows;Position options;Education;CDC milk storage guidelines;CDC Guidelines for Breast Pump Cleaning  Discharge Pump: Personal;DEBP;Hands Free  Consult Status Consult Status: Follow-up Date: 10/14/24 Follow-up type: In-patient    Rollene Caldron Hillary Struss 10/14/2024, 8:55 AM

## 2024-10-15 NOTE — Discharge Summary (Signed)
 Postpartum Discharge Summary       Patient Name: Vicki Hooper DOB: 29-Apr-1996 MRN: [id number]  Date of admission: 10/13/2024 Delivery date:10/14/2024 Delivering provider: DELANA TED MORRISON Date of discharge: 10/15/2024  Admitting diagnosis: Term pregnancy [Z34.90] Intrauterine pregnancy: [redacted]w[redacted]d     Secondary diagnosis:  Principal Problem:   Term pregnancy    Discharge diagnosis: Term Pregnancy Delivered                                              Post partum procedures:NA Augmentation: AROM, Pitocin , and Cytotec Complications: None  Hospital course: Induction of Labor With Vaginal Delivery   28 y.o. yo [phone] at [redacted]w[redacted]d was admitted to the hospital 10/13/2024 for induction of labor.  Indication for induction: Elective.  Patient had an labor course complicated by nothing Membrane Rupture Time/Date: 2:15 PM,10/13/2024  Delivery Method:Vaginal, Spontaneous Operative Delivery:N/A Episiotomy: None Lacerations:  None Details of delivery can be found in separate delivery note.  Patient had a postpartum course complicated by nothing. Patient is discharged home 10/15/24.  Newborn Data: Birth date:10/14/2024 Birth time:1:10 AM Gender:Female Living status:Living Apgars:8 ,9  Weight:3160 g  Magnesium  Sulfate received: No BMZ received: No Rhophylac:N/A Immunizations administered: Immunization History  Administered Date(s) Administered   Influenza Inj Mdck Quad Pf 01/24/2021, 10/04/2022   Tdap 12/09/2019, 06/16/2021, 03/06/2023    Physical exam  Vitals:   10/14/24 1400 10/14/24 1800 10/14/24 2018 10/15/24 0522  BP: 131/83 127/81 126/76 116/80  Pulse: 61 68 65 67  Resp: 18 18 18 17   Temp: 98.1 F (36.7 C) 98.4 F (36.9 C) 98.1 F (36.7 C) 97.9 F (36.6 C)  TempSrc: Oral Oral Oral Oral  SpO2: 98% 98% 97% 99%  Weight:      Height:       General: alert, cooperative, and no distress Lochia: appropriate Uterine Fundus: firm DVT Evaluation: No evidence of  DVT seen on physical exam. Labs: Lab Results  Component Value Date   WBC 21.1 (H) 10/14/2024   HGB 10.3 (L) 10/14/2024   HCT 32.7 (L) 10/14/2024   MCV 89.8 10/14/2024   PLT 245 10/14/2024      Latest Ref Rng & Units 09/21/2024    7:52 PM  CMP  Glucose 70 - 99 mg/dL 77   BUN 6 - 20 mg/dL 6   Creatinine 9.55 - 8.99 mg/dL 9.38   Sodium 864 - 854 mmol/L 136   Potassium 3.5 - 5.1 mmol/L 3.7   Chloride 98 - 111 mmol/L 106   CO2 22 - 32 mmol/L 19   Calcium  8.9 - 10.3 mg/dL 8.9   Total Protein 6.5 - 8.1 g/dL 6.4   Total Bilirubin 0.0 - 1.2 mg/dL 0.6   Alkaline Phos 38 - 126 U/L 124   AST 15 - 41 U/L 21   ALT 0 - 44 U/L 24    Edinburgh Score:    10/14/2024    7:10 AM  Edinburgh Postnatal Depression Scale Screening Tool  I have been able to laugh and see the funny side of things. 0  I have looked forward with enjoyment to things. 0  I have blamed myself unnecessarily when things went wrong. 1  I have been anxious or worried for no good reason. 0  I have felt scared or panicky for no good reason. 0  Things have been getting on top of  me. 1  I have been so unhappy that I have had difficulty sleeping. 0  I have felt sad or miserable. 1  I have been so unhappy that I have been crying. 0  The thought of harming myself has occurred to me. 0  Edinburgh Postnatal Depression Scale Total 3      After visit meds:  Allergies as of 10/15/2024   No Known Allergies      Medication List     STOP taking these medications    acetaminophen  325 MG tablet Commonly known as: TYLENOL    promethazine  25 MG tablet Commonly known as: PHENERGAN        TAKE these medications    multivitamin-prenatal 27-0.8 MG Tabs tablet Take 1 tablet by mouth daily at 4 PM.   sertraline  50 MG tablet Commonly known as: ZOLOFT  Take 50 mg by mouth daily.               Discharge Care Instructions  (From admission, onward)           Start     Ordered   10/15/24 0000  Discharge wound  care:       Comments: For a cesarean delivery: You may wash incision with soap and water .  Do not soak or submerge the incision for 2 weeks. Keep incision dry. You may need to keep a sanitary pad or panty liner between the incision and your clothing for comfort and to keep the incision dry. If you note drainage, increased pain, or increased redness of the incision, then please notify your physician.   10/15/24 1408   10/15/24 0000  If the dressing is still on your incision site when you go home, remove it on the third day after your surgery date. Remove dressing if it begins to fall off, or if it is dirty or damaged before the third day.       Comments: For a cesarean delivery   10/15/24 1408             Discharge home in stable condition Infant Feeding: Breast Infant Disposition:rooming in Discharge instruction: per After Visit Summary and Postpartum booklet. Activity: Advance as tolerated. Pelvic rest for 6 weeks.  Diet: routine diet Anticipated Birth Control: Unsure Postpartum Appointment:4 weeks Future Appointments:No future appointments. Follow up Visit:  Follow-up Information     [facility] Follow up in 4 week(s).   Why: For postpartum evaluation Contact information: [address]  [address] [place] [address] [phone]                     10/15/2024 Marjorie Gull, MD

## 2024-10-15 NOTE — Social Work (Signed)
 [name] was referred for history of depression/anxiety.  * Referral screened out by Clinical Social Worker because none of the following criteria appear to apply:  ~ History of anxiety/depression during this pregnancy, or of post-partum depression following prior delivery.  ~ Diagnosis of anxiety and/or depression within last 3 years OR * [name]'s symptoms currently being treated with medication and/or therapy. Per Ob records [name] has an active prescription for Sertraline 50mg . Edinburgh=3  Please contact the Clinical Social Worker if needs arise, or by [name] request.   Vicki Hooper, LCSWA Clinical Social Worker [phone]

## 2024-10-16 LAB — SURGICAL PATHOLOGY

## 2024-10-16 NOTE — Lactation Note (Signed)
 This note was copied from a baby's chart. Lactation Consultation Note  Patient Name: Vicki Hooper Unijb'd Date: 10/16/2024 Age:30 hours Reason for consult: Follow-up assessment;Term;Infant weight loss;Hyperbilirubinemia (mom has been pumping with her hands free DEBP and her milk is in denies engorgement. Bilirubin has decreased. still on Double Photo) Mom denies engorgement .  LC reviewed the breast feeding d/c teaching and praised mom for her extra pumping , stressed the importance of engorgement prevention and tx.  LC recommended if there is EBM available to feed the baby it so enhance the Bilirubin to decrease.  LC also recommended to continue to work on latching and the supplement at least 30 ml afterwards, post pump both breast and then the next feeding switch to the other breast.  LC offered mom and LC O/P and per mom has a LC in the community she used before.   Maternal Data Has patient been taught Hand Expression?: Yes  Feeding Mother's Current Feeding Choice: Breast Milk and Formula Nipple Type: Dr. Jonna Eleuterio Galli    Lactation Tools Discussed/Used    Interventions  Education   Discharge Discharge Education: Engorgement and breast care;Warning signs for feeding baby;Outpatient recommendation;Other (comment) (per mom  has a LC in the community) Pump: Personal;Hands Free;DEBP;Manual  Consult Status Consult Status: Complete Date: 10/16/24    Rollene Jenkins Fiedler 10/16/2024, 10:53 AM

## 2024-10-26 ENCOUNTER — Inpatient Hospital Stay (HOSPITAL_COMMUNITY)

## 2024-10-27 ENCOUNTER — Telehealth (HOSPITAL_COMMUNITY): Payer: Self-pay | Admitting: *Deleted

## 2024-10-27 NOTE — Telephone Encounter (Signed)
 10/27/2024  Name: Vicki Hooper MRN: [id number] DOB: 06-10-1996  Reason for Call:  Transition of Care Hospital Discharge Call  Contact Status: Patient Contact Status: Complete  Language assistant needed: Interpreter Mode: Interpreter Not Needed        Follow-Up Questions: Do You Have Any Concerns About Your Health As You Heal From Delivery?: No Do You Have Any Concerns About Your Infants Health?: No  Edinburgh Postnatal Depression Scale:  In the Past 7 Days: I have been able to laugh and see the funny side of things.: As much as I always could I have looked forward with enjoyment to things.: As much as I ever did I have blamed myself unnecessarily when things went wrong.: No, never I have been anxious or worried for no good reason.: No, not at all I have felt scared or panicky for no good reason.: No, not at all Things have been getting on top of me.: No, I have been coping as well as ever I have been so unhappy that I have had difficulty sleeping.: Not at all I have felt sad or miserable.: No, not at all I have been so unhappy that I have been crying.: No, never The thought of harming myself has occurred to me.: Never [place] Postnatal Depression Scale Total: 0  PHQ2-9 Depression Scale:     Discharge Follow-up: Edinburgh score requires follow up?: No Patient was advised of the following resources:: Breastfeeding Support Group, Support Group  Post-discharge interventions: Reviewed Newborn Safe Sleep Practices  [name]  10/27/2024 1619
